# Patient Record
Sex: Male | Born: 1980
Health system: Southern US, Community
[De-identification: ages and names within clinical notes are randomized; demographics above are authoritative.]

## PROBLEM LIST (undated history)

## (undated) DIAGNOSIS — M25562 Pain in left knee: Secondary | ICD-10-CM

## (undated) DIAGNOSIS — S42309A Unspecified fracture of shaft of humerus, unspecified arm, initial encounter for closed fracture: Secondary | ICD-10-CM

## (undated) DIAGNOSIS — G8929 Other chronic pain: Secondary | ICD-10-CM

## (undated) HISTORY — DX: Pain in left knee: M25.562

## (undated) HISTORY — DX: Unspecified fracture of shaft of humerus, unspecified arm, initial encounter for closed fracture: S42.309A

## (undated) HISTORY — DX: Other chronic pain: G89.29

---

## 2012-06-14 ENCOUNTER — Ambulatory Visit (INDEPENDENT_AMBULATORY_CARE_PROVIDER_SITE_OTHER): Payer: BC Managed Care – PPO | Admitting: Physician Assistant

## 2012-06-14 VITALS — BP 110/65 | HR 74 | Temp 98.2°F | Resp 16 | Ht 68.0 in | Wt 223.0 lb

## 2012-06-14 DIAGNOSIS — H9209 Otalgia, unspecified ear: Secondary | ICD-10-CM

## 2012-06-14 DIAGNOSIS — J069 Acute upper respiratory infection, unspecified: Secondary | ICD-10-CM

## 2012-06-14 DIAGNOSIS — J029 Acute pharyngitis, unspecified: Secondary | ICD-10-CM

## 2012-06-14 LAB — POCT RAPID STREP A (OFFICE): Rapid Strep A Screen: NEGATIVE

## 2012-06-14 MED ORDER — AMOXICILLIN 875 MG PO TABS: 875.0000 mg | ORAL_TABLET | Freq: Two times a day (BID) | ORAL | Status: DC

## 2012-06-14 MED ORDER — IPRATROPIUM BROMIDE 0.03 % NA SOLN: 2.0000 | Freq: Two times a day (BID) | NASAL | Status: DC

## 2012-06-14 MED ORDER — GUAIFENESIN ER 1200 MG PO TB12: 1.0000 | ORAL_TABLET | Freq: Two times a day (BID) | ORAL | Status: DC | PRN

## 2012-06-14 NOTE — Progress Notes (Signed)
  Subjective:    Patient ID: Luke Brown, male    DOB: 10/29/1980, 31 y.o.   MRN: [id number]  HPI This 31 y.o. male presents for evaluation of illness.  Began 5 days ago with scratchy throat, achey.  No runny nose, cough.  Feels the drainage in the back of his throat, sinus pressure, and fullness/popping in his ears with swallowing.  The post-nasal drainage keeps him awake at night and he's exhausted from not sleeping.  No history of sinusitis.  No nausea, vomiting or diarrhea. His 40 year-old son, who attends daycare, had a cold about a week ago, accompanied by GI symptoms.  Past Medical History  Diagnosis Date  . Fracture of arm     History reviewed. No pertinent past surgical history.  Prior to Admission medications   Not on File    No Known Allergies  History   Social History  . Marital Status: Married    Spouse Name: Jaeshawn Silvio    Number of Children: 1  . Years of Education: 16   Occupational History  . Training and development officer   Social History Main Topics  . Smoking status: Never Smoker   . Smokeless tobacco: Never Used  . Alcohol Use: No  . Drug Use: No  . Sexually Active: Yes -- Male partner(s)    Birth Control/ Protection: None   Other Topics Concern  . Not on file   Social History Narrative   Lives with wife and their son Elisandro, Jarrett., 10/05/2009).    Family History  Problem Relation Age of Onset  . Diabetes Mother     Review of Systems As above.    Objective:   Physical Exam Blood pressure 110/65, pulse 74, temperature 98.2 F (36.8 C), temperature source Oral, resp. rate 16, height 5\' 8"  (1.727 m), weight 223 lb (101.152 kg), SpO2 99.00%. Body mass index is 33.91 kg/(m^2). Well-developed, well nourished BM who is awake, alert and oriented, in NAD. HEENT: [place]/AT, PERRL, EOMI.  Sclera and conjunctiva are clear.  EAC are patent, TMs are normal in appearance. Nasal mucosa is pink and moist. OP with exudative tonsils bilaterally. Neck:  supple, non-tender, no lymphadenopathy, thyromegaly. Heart: RRR, no murmur Lungs: normal effort, CTA Extremities: no cyanosis, clubbing or edema. Skin: warm and dry without rash. Psychologic: good mood and appropriate affect, normal speech and behavior.  Results for orders placed in visit on 06/14/12  POCT RAPID STREP A (OFFICE)      Component Value Range   Rapid Strep A Screen Negative  Negative      Assessment & Plan:   1. Acute pharyngitis  POCT rapid strep A, Culture, Group A Strep, amoxicillin (AMOXIL) 875 MG tablet  2. Otalgia    3. URI (upper respiratory infection)  ipratropium (ATROVENT) 0.03 % nasal spray, Guaifenesin (MUCINEX MAXIMUM STRENGTH) 1200 MG TB12   Supportive care. Anticipatory guidance provided.

## 2012-06-14 NOTE — Patient Instructions (Signed)
Get plenty of rest and drink at least 64 ounces of water daily. 

## 2014-08-29 ENCOUNTER — Ambulatory Visit (INDEPENDENT_AMBULATORY_CARE_PROVIDER_SITE_OTHER): Payer: BLUE CROSS/BLUE SHIELD | Admitting: Family Medicine

## 2014-08-29 VITALS — BP 118/66 | HR 73 | Temp 98.9°F | Resp 16 | Ht 67.75 in | Wt 194.0 lb

## 2014-08-29 DIAGNOSIS — R5383 Other fatigue: Secondary | ICD-10-CM

## 2014-08-29 DIAGNOSIS — R51 Headache: Secondary | ICD-10-CM

## 2014-08-29 DIAGNOSIS — G4700 Insomnia, unspecified: Secondary | ICD-10-CM

## 2014-08-29 DIAGNOSIS — R519 Headache, unspecified: Secondary | ICD-10-CM

## 2014-08-29 LAB — POCT CBC
Granulocyte percent: 77.8 % (ref 37–80)
HCT, POC: 42.6 % — AB (ref 43.5–53.7)
Hemoglobin: 13.4 g/dL — AB (ref 14.1–18.1)
Lymph, poc: 1.5 (ref 0.6–3.4)
MCH, POC: 26.7 pg — AB (ref 27–31.2)
MCHC: 31.4 g/dL — AB (ref 31.8–35.4)
MCV: 85.2 fL (ref 80–97)
MID (cbc): 0.3 (ref 0–0.9)
MPV: 8.4 fL (ref 0–99.8)
POC Granulocyte: 6.1 (ref 2–6.9)
POC LYMPH PERCENT: 18.4 %L (ref 10–50)
POC MID %: 3.8 % (ref 0–12)
Platelet Count, POC: 161 10*3/uL (ref 142–424)
RBC: 5 M/uL (ref 4.69–6.13)
RDW, POC: 15.8 %
WBC: 7.9 10*3/uL (ref 4.6–10.2)

## 2014-08-29 LAB — COMPLETE METABOLIC PANEL WITHOUT GFR
AST: 24 U/L (ref 0–37)
Albumin: 4.5 g/dL (ref 3.5–5.2)
Alkaline Phosphatase: 32 U/L — ABNORMAL LOW (ref 39–117)
Calcium: 9.4 mg/dL (ref 8.4–10.5)
Creat: 1.09 mg/dL (ref 0.50–1.35)
GFR, Est African American: >89 mL/min
Glucose, Bld: 99 mg/dL (ref 70–99)
Potassium: 4.5 meq/L (ref 3.5–5.3)
Sodium: 140 meq/L (ref 135–145)
Total Bilirubin: 0.4 mg/dL (ref 0.2–1.2)
Total Protein: 7.3 g/dL (ref 6.0–8.3)

## 2014-08-29 LAB — COMPLETE METABOLIC PANEL WITH GFR
ALT: 18 U/L (ref 0–53)
BUN: 13 mg/dL (ref 6–23)
CO2: 26 mEq/L (ref 19–32)
Chloride: 104 mEq/L (ref 96–112)
GFR, Est Non African American: 89 mL/min

## 2014-08-29 MED ORDER — ZOLPIDEM TARTRATE 5 MG PO TABS: 5.0000 mg | ORAL_TABLET | Freq: Every evening | ORAL | Status: DC | PRN

## 2014-08-29 MED ORDER — BUTALBITAL-APAP-CAFF-COD 50-325-40-30 MG PO CAPS: 1.0000 | ORAL_CAPSULE | Freq: Four times a day (QID) | ORAL | Status: DC | PRN

## 2014-08-29 NOTE — Progress Notes (Signed)
Chief Complaint:  Chief Complaint  Patient presents with  . Headache    X2days  . Insomnia  . Fatigue    HPI: Luke Brown is a 34 y.o. male who is here for 3 day history of diffuse frontal and top of the head headache, chills last night, fatigue and also few msk aches .  HE has been working out and felt better but then came right back. He has been hydrating . No etoh in the last few days. Has had sick contacts. No cough, sore throat or ear pain or facial pain. No neck pain, rashes, n/v/abd pain diarrhea. LAst week was a rough sleeping week , he got 3-4 hrs per night No CP, SOB, palptiation, he feels some dizziness, some photsensitivoty Eating and drinking ok. He works 2nd shift.  He has problems tosss and turn, he works out in the AM, he sleeps 3-4 hrs and the rest is toss and turn He does not have a hx of HA It is uncomfortable , he has taken ibuprofen 200 mg 2 pills every 6 hours without relief.   Past Medical History  Diagnosis Date  . Fracture of arm    History reviewed. No pertinent past surgical history. History   Social History  . Marital Status: Married    Spouse Name: Charleston PootBilan Dupree    Number of Children: 1  . 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 Smoking status: Never Smoker   . Smokeless tobacco: Never Used  . Alcohol Use: No  . Drug Use: No  . Sexual Activity:    Partners: Female    CopyBirth Control/ Protection: None   Other Topics Concern  . None   Social History Narrative   Lives with wife and their son Ilda Basset(Damarius, Jr., 10/05/2009).   Family History  Problem Relation Age of Onset  . Diabetes Mother    No Known Allergies Prior to Admission medications   Not on File     ROS: The patient denies fevers, chills, night sweats, unintentional weight loss, chest pain, palpitations, wheezing, dyspnea on exertion, nausea, vomiting, abdominal pain, dysuria, hematuria,  melena, numbness, weakness, or tingling.   All other systems have been reviewed and were otherwise negative with the exception of those mentioned in the HPI and as above.    PHYSICAL EXAM: Filed Vitals:   08/29/14 0859  BP: 118/66  Pulse: 73  Temp: 98.9 F (37.2 C)  Resp: 16   Filed Vitals:   08/29/14 0859  Height: 5' 7.75" (1.721 m)  Weight: 194 lb (87.998 kg)   Body mass index is 29.71 kg/(m^2).  General: Alert, no acute distress HEENT:  Normocephalic, atraumatic, oropharynx patent. EOMI, PERRLA Erythematous throat, no exudates, TM normal, neg sinus tenderness, + erythematous/boggy nasal mucosa Cardiovascular:  Regular rate and rhythm, no rubs murmurs or gallops.  No Carotid bruits, radial pulse intact. No pedal edema.  Respiratory: Clear to auscultation bilaterally.  No wheezes, rales, or rhonchi.  No cyanosis, no use of accessory musculature GI: No organomegaly, abdomen is soft and non-tender, positive bowel sounds.  No masses. Skin: No rashes. Neurologic: Facial musculature symmetric. Psychiatric: Patient is appropriate throughout our interaction. Lymphatic: No cervical lymphadenopathy Musculoskeletal: Gait intact.   LABS: Results for orders placed or performed in visit on 08/29/14  POCT CBC  Result Value Ref Range   WBC 7.9 4.6 - 10.2 K/uL  Lymph, poc 1.5 0.6 - 3.4   POC LYMPH PERCENT 18.4 10 - 50 %L   MID (cbc) 0.3 0 - 0.9   POC MID % 3.8 0 - 12 %M   POC Granulocyte 6.1 2 - 6.9   Granulocyte percent 77.8 37 - 80 %G   RBC 5.00 4.69 - 6.13 M/uL   Hemoglobin 13.4 (A) 14.1 - 18.1 g/dL   HCT, POC 16.1 (A) 09.6 - 53.7 %   MCV 85.2 80 - 97 fL   MCH, POC 26.7 (A) 27 - 31.2 pg   MCHC 31.4 (A) 31.8 - 35.4 g/dL   RDW, POC 04.5 %   Platelet Count, POC 161 142 - 424 K/uL   MPV 8.4 0 - 99.8 fL     EKG/XRAY:   Primary read interpreted by Dr. Conley Rolls at [facility].   ASSESSMENT/PLAN: Encounter Diagnoses  Name Primary?  . Headache, unspecified headache type Yes  . Other  fatigue   . Insomnia    Viral syndrome vs early acute sinusitis I would like him to try sxs treatment first, get some good rest and sleep and if no improvement then give abx Push fluids Rx fioricet and also ambien F/u prn    Gross sideeffects, risk and benefits, and alternatives of medications d/w patient. Patient is aware that all medications have potential sideeffects and we are unable to predict every sideeffect or drug-drug interaction that may occur.  Milady Fleener PHUONG, DO 08/29/2014 10:05 AM

## 2014-08-31 ENCOUNTER — Telehealth: Payer: Self-pay

## 2014-08-31 ENCOUNTER — Other Ambulatory Visit: Payer: Self-pay | Admitting: Family Medicine

## 2014-08-31 DIAGNOSIS — J0120 Acute ethmoidal sinusitis, unspecified: Secondary | ICD-10-CM

## 2014-08-31 MED ORDER — AMOXICILLIN-POT CLAVULANATE 875-125 MG PO TABS: 1.0000 | ORAL_TABLET | Freq: Two times a day (BID) | ORAL | Status: DC

## 2014-08-31 NOTE — Telephone Encounter (Addendum)
Pt stopped in and wanted to let Dr Conley RollsLe know that he has developed a fever Monday evening and some back aches. Fever is know gone but headache and back pain remain. He is wondering if he has a infection. Please advise.He can be reached at [phone], Thank you

## 2015-07-20 ENCOUNTER — Ambulatory Visit (INDEPENDENT_AMBULATORY_CARE_PROVIDER_SITE_OTHER): Payer: BLUE CROSS/BLUE SHIELD | Admitting: Emergency Medicine

## 2015-07-20 VITALS — BP 130/62 | HR 68 | Temp 98.0°F | Resp 16 | Ht 69.25 in | Wt 198.0 lb

## 2015-07-20 DIAGNOSIS — L0100 Impetigo, unspecified: Secondary | ICD-10-CM

## 2015-07-20 MED ORDER — SULFAMETHOXAZOLE-TRIMETHOPRIM 800-160 MG PO TABS: 1.0000 | ORAL_TABLET | Freq: Two times a day (BID) | ORAL | Status: DC

## 2015-07-20 MED ORDER — CEPHALEXIN 500 MG PO CAPS: 500.0000 mg | ORAL_CAPSULE | Freq: Four times a day (QID) | ORAL | Status: DC

## 2015-07-20 NOTE — Patient Instructions (Signed)
Impetigo, Adult  Impetigo is an infection of the skin. It commonly occurs in young children, but it can also occur in adults. The infection causes itchy blisters and sores that produce brownish-yellow fluid. As the fluid dries, it forms a thick, honey-colored crust. These skin changes usually occur on the face but can also affect other areas of the body. Impetigo usually goes away in 7-10 days with treatment.  CAUSES  Impetigo is caused by two types of bacteria. It may be caused by staphylococci or streptococci bacteria. These bacteria cause impetigo when they get under the surface of the skin. This often happens after some damage to the skin, such as damage from:  · Cuts, scrapes, or scratches.  · Insect bites, especially when you scratch the area of a bite.  · Chickenpox or other illnesses that cause open skin sores.  · Nail biting or chewing.  Impetigo is contagious and can spread easily from one person to another. This may occur through close skin contact or by sharing towels, clothing, or other items with a person who has the infection.  RISK FACTORS  Some things that can increase the risk of getting this infection include:  · Playing sports that include skin-to-skin contact with others.  · Having a skin condition with open sores.  · Having many skin cuts or scrapes.  · Living in an area that has high humidity levels.  · Having poor hygiene.  · Having high levels of staphylococci in your nose.  SIGNS AND SYMPTOMS  Impetigo usually starts out as small blisters, often on the face. The blisters then break open and turn into tiny sores (lesions) with a yellow crust. In some cases, the blisters cause itching or burning. With scratching, irritation, or lack of treatment, these small lesions may get larger. Scratching can also cause impetigo to spread to other parts of the body. The bacteria can get under the fingernails and spread when you touch another area of your skin.  Other possible symptoms include:  · Larger  blisters.  · Pus.  · Swollen lymph glands.  DIAGNOSIS  This condition is usually diagnosed during a physical exam. A skin sample or sample of fluid from a blister may be taken for lab tests that involve growing bacteria (culture test). This can help confirm the diagnosis or help determine the best treatment.  TREATMENT  Mild impetigo can be treated with prescription antibiotic cream. Oral antibiotic medicine may be used in more severe cases. Medicines for itching may also be used.  HOME CARE INSTRUCTIONS  · Take medicines only as directed by your health care provider.  · To help prevent impetigo from spreading to other body areas:    Keep your fingernails short and clean.    Do not scratch the blisters or sores.    Cover infected areas, if necessary, to keep from scratching.  · Gently wash the infected areas with antibiotic soap and water.  · Soak crusted areas in warm, soapy water using antibiotic soap.    Gently rub the areas to remove crusts. Do not scrub.  · Wash your hands often to avoid spreading this infection.  · Stay home until you have used an antibiotic cream for 48 hours (2 days) or an oral antibiotic medicine for 24 hours (1 day). You should only return to work and activities with other people if your skin shows significant improvement.  PREVENTION   To keep the infection from spreading:  · Stay home until you have used   an antibiotic cream for 48 hours or an oral antibiotic for 24 hours.  · Wash your hands often.  · Do not engage in skin-to-skin contact with other people while you have still have blisters.  · Do not share towels, washcloths, or bedding with others while you have the infection.  SEEK MEDICAL CARE IF:  · You develop more blisters or sores despite treatment.  · Other family members get sores.  · Your skin sores are not improving after 48 hours of treatment.  · You have a fever.  SEEK IMMEDIATE MEDICAL CARE IF:  · You see spreading redness or swelling of the skin around your sores.  · You  see red streaks coming from your sores.  · You develop a sore throat.     This information is not intended to replace advice given to you by your health care provider. Make sure you discuss any questions you have with your health care provider.     Document Released: 07/29/2014 Document Reviewed: 07/29/2014  Elsevier Interactive Patient Education ©2016 [employer] Inc[employer]

## 2015-07-20 NOTE — Progress Notes (Signed)
Subjective:  Patient ID: Luke Brown, male    DOB: 1980-08-27  Age: 34 y.o. MRN: [id number]  CC: Mouth Lesions and Oral Swelling   HPI Luke BergamoMarcus Brown presents  with a history of herpes labialis. Had a lesion on his lower lip that now has resulted in having a markedly swollen lower lip is no fever or chills. He has no abscess. His lipids to uniformly diffusely swollen he has a lesion in the middle portion of this lip which is crusted over. He has no other complaints. No nasal congestion postnasal drainage or sore throat.  History Luke Brown has a past medical history of Fracture of arm.   He has no past surgical history on file.   His  family history includes Diabetes in his mother.  He   reports that he has never smoked. He has never used smokeless tobacco. He reports that he does not drink alcohol or use illicit drugs.  Outpatient Prescriptions Prior to Visit  Medication Sig Dispense Refill  . amoxicillin-clavulanate (AUGMENTIN) 875-125 MG per tablet Take 1 tablet by mouth 2 (two) times daily. May cause nausea, abd pain, diarrhea, vomiting, rash. Take with food (Patient not taking: Reported on 07/20/2015) 20 tablet 0  . butalbital-acetaminophen-caffeine (FIORICET/CODEINE) 50-325-40-30 MG per capsule Take 1 capsule by mouth every 6 (six) hours as needed for headache. (Patient not taking: Reported on 07/20/2015) 20 capsule 0  . zolpidem (AMBIEN) 5 MG tablet Take 1 tablet (5 mg total) by mouth at bedtime as needed for sleep. (Patient not taking: Reported on 07/20/2015) 15 tablet 0   No facility-administered medications prior to visit.    Social History   Social History  . Marital Status: Married    Spouse Name: Luke Brown  . Number of Children: 1  . 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 Smoking status: Never Smoker   . Smokeless tobacco: Never Used  . Alcohol Use: No  . Drug Use: No  .  Sexual Activity:    Partners: Female    CopyBirth Control/ Protection: None   Other Topics Concern  . None   Social History Narrative   Lives with wife and their son Luke Brown(Rami, Jr., 10/05/2009).     Review of Systems  Constitutional: Negative for fever, chills and appetite change.  HENT: Positive for facial swelling and mouth sores. Negative for congestion, ear pain, postnasal drip, sinus pressure and sore throat.   Eyes: Negative for pain and redness.  Respiratory: Negative for cough, shortness of breath and wheezing.   Cardiovascular: Negative for leg swelling.  Gastrointestinal: Negative for nausea, vomiting, abdominal pain, diarrhea, constipation and blood in stool.  Endocrine: Negative for polyuria.  Genitourinary: Negative for dysuria, urgency, frequency and flank pain.  Musculoskeletal: Negative for gait problem.  Skin: Negative for rash.  Neurological: Negative for weakness and headaches.  Psychiatric/Behavioral: Negative for confusion and decreased concentration. The patient is not nervous/anxious.     Objective:  BP 130/62 mmHg  Pulse 68  Temp(Src) 98 F (36.7 C) (Oral)  Resp 16  Ht 5' 9.25" (1.759 m)  Wt 198 lb (89.812 kg)  BMI 29.03 kg/m2  SpO2 98%  Physical Exam  Constitutional: He is oriented to person, place, and time. He appears well-developed and well-nourished. No distress.  HENT:  Head: Normocephalic and atraumatic.  Right Ear: External ear normal.  Left Ear: External ear normal.  Nose: Nose normal.  Marked swelling lower lip with impetigo  Eyes: Conjunctivae and EOM are normal. Pupils are equal, round, and reactive to light. No scleral icterus.  Neck: Normal range of motion. Neck supple. No tracheal deviation present.  Cardiovascular: Normal rate, regular rhythm and normal heart sounds.   Pulmonary/Chest: Effort normal. No respiratory distress. He has no wheezes. He has no rales.  Abdominal: He exhibits no mass. There is no tenderness. There is no  rebound and no guarding.  Musculoskeletal: He exhibits no edema.  Lymphadenopathy:    He has no cervical adenopathy.  Neurological: He is alert and oriented to person, place, and time. Coordination normal.  Skin: Skin is warm and dry. No rash noted.  Psychiatric: He has a normal mood and affect. His behavior is normal.      Assessment & Plan:   Luke Brown was seen today for mouth lesions and oral swelling.  Diagnoses and all orders for this visit:  Impetigo  Other orders -     sulfamethoxazole-trimethoprim (BACTRIM DS,SEPTRA DS) 800-160 MG tablet; Take 1 tablet by mouth 2 (two) times daily. -     cephALEXin (KEFLEX) 500 MG capsule; Take 1 capsule (500 mg total) by mouth 4 (four) times daily.  I am having Luke Brown start on sulfamethoxazole-trimethoprim and cephALEXin. I am also having him maintain his butalbital-acetaminophen-caffeine, zolpidem, amoxicillin-clavulanate, and ValACYclovir HCl (VALTREX PO).  Meds ordered this encounter  Medications  . ValACYclovir HCl (VALTREX PO)    Sig: Take by mouth.  . sulfamethoxazole-trimethoprim (BACTRIM DS,SEPTRA DS) 800-160 MG tablet    Sig: Take 1 tablet by mouth 2 (two) times daily.    Dispense:  20 tablet    Refill:  0  . cephALEXin (KEFLEX) 500 MG capsule    Sig: Take 1 capsule (500 mg total) by mouth 4 (four) times daily.    Dispense:  40 capsule    Refill:  0    Appropriate red flag conditions were discussed with the patient as well as actions that should be taken.  Patient expressed his understanding.  Follow-up: Return if symptoms worsen or fail to improve.  Carmelina Dane, MD

## 2016-07-20 ENCOUNTER — Encounter: Payer: Self-pay | Admitting: Emergency Medicine

## 2016-07-20 ENCOUNTER — Emergency Department
Admission: EM | Admit: 2016-07-20 | Discharge: 2016-07-20 | Disposition: A | Discharge status: Home / Self Care | Payer: BLUE CROSS/BLUE SHIELD | Source: Home / Self Care | Attending: Family Medicine | Admitting: Family Medicine

## 2016-07-20 DIAGNOSIS — H6692 Otitis media, unspecified, left ear: Secondary | ICD-10-CM

## 2016-07-20 DIAGNOSIS — R6889 Other general symptoms and signs: Secondary | ICD-10-CM

## 2016-07-20 MED ORDER — BENZONATATE 100 MG PO CAPS: 100.0000 mg | ORAL_CAPSULE | Freq: Three times a day (TID) | ORAL | 0 refills | Status: DC

## 2016-07-20 MED ORDER — AMOXICILLIN-POT CLAVULANATE 875-125 MG PO TABS: 1.0000 | ORAL_TABLET | Freq: Two times a day (BID) | ORAL | 0 refills | Status: DC

## 2016-07-20 MED ORDER — OSELTAMIVIR PHOSPHATE 75 MG PO CAPS: 75.0000 mg | ORAL_CAPSULE | Freq: Two times a day (BID) | ORAL | 0 refills | Status: DC

## 2016-07-20 NOTE — ED Provider Notes (Signed)
CSN: [id number]     Arrival date & time 07/20/16  1205 History   First MD Initiated Contact with Patient 07/20/16 1258     Chief Complaint  Patient presents with  . Fever   (Consider location/radiation/quality/duration/timing/severity/associated sxs/prior Treatment) HPI  [name] is a 35 y.o. male presenting to UC with c/o 2 days of sudden onset, gradually worsening fatigue, body aches, sore throat, headache and bilateral ear pain.  He had Tylenol at 9AM.  Fever tmax 103*F. He did not get the flu vaccine this season. Denies vomiting or diarrhea but has had some nausea.    Past Medical History:  Diagnosis Date  . Fracture of arm    History reviewed. No pertinent surgical history. Family History  Problem Relation Age of Onset  . Diabetes Mother    Social History  Substance Use Topics  . Smoking status: Never Smoker  . Smokeless tobacco: Never Used  . Alcohol use No    Review of Systems  Constitutional: Positive for fatigue and fever. Negative for chills.  HENT: Positive for congestion, ear pain (bilateral), postnasal drip, rhinorrhea and sore throat. Negative for trouble swallowing and voice change.   Respiratory: Positive for cough. Negative for shortness of breath.   Cardiovascular: Negative for chest pain and palpitations.  Gastrointestinal: Negative for abdominal pain, diarrhea, nausea and vomiting.  Musculoskeletal: Positive for arthralgias and myalgias. Negative for back pain.  Skin: Negative for rash.    Allergies  Patient has no known allergies.  Home Medications   Prior to Admission medications   Medication Sig Start Date End Date Taking? Authorizing Provider  amoxicillin-clavulanate (AUGMENTIN) 875-125 MG tablet Take 1 tablet by mouth 2 (two) times daily. One po bid x 7 days 07/20/16   Junius FinnerErin O'Malley, PA-C  benzonatate (TESSALON) 100 MG capsule Take 1-2 capsules (100-200 mg total) by mouth every 8 (eight) hours. 07/20/16   Junius FinnerErin O'Malley, PA-C   butalbital-acetaminophen-caffeine (FIORICET/CODEINE) 50-325-40-30 MG per capsule Take 1 capsule by mouth every 6 (six) hours as needed for headache. Patient not taking: Reported on 07/20/2015 08/29/14   Thao P Le, DO  cephALEXin (KEFLEX) 500 MG capsule Take 1 capsule (500 mg total) by mouth 4 (four) times daily. 07/20/15   Carmelina DaneJeffery S Anderson, MD  oseltamivir (TAMIFLU) 75 MG capsule Take 1 capsule (75 mg total) by mouth every 12 (twelve) hours. 07/20/16   Junius FinnerErin O'Malley, PA-C  sulfamethoxazole-trimethoprim (BACTRIM DS,SEPTRA DS) 800-160 MG tablet Take 1 tablet by mouth 2 (two) times daily. 07/20/15   Carmelina DaneJeffery S Anderson, MD  ValACYclovir HCl (VALTREX PO) Take by mouth.    Historical Provider, MD  zolpidem (AMBIEN) 5 MG tablet Take 1 tablet (5 mg total) by mouth at bedtime as needed for sleep. Patient not taking: Reported on 07/20/2015 08/29/14   Thao P Le, DO   Meds Ordered and Administered this Visit  Medications - No data to display  BP 113/75 (BP Location: Right Arm)   Pulse 68   Temp 101.5 F (38.6 C) (Oral)   Wt 200 lb (90.7 kg)   SpO2 100%   BMI 29.32 kg/m  No data found.   Physical Exam  Constitutional: He appears well-developed and well-nourished.  Non-toxic appearance. He appears ill (very fatigued, but no cardiorespiratory distress). No distress.  HENT:  Head: Normocephalic and atraumatic.  Right Ear: Tympanic membrane and external ear normal.  Left Ear: External ear normal. Tympanic membrane is erythematous and bulging.  Nose: Rhinorrhea present.  Mouth/Throat: Uvula is midline and mucous membranes  are normal. Posterior oropharyngeal erythema (mild redness ) present. No oropharyngeal exudate.  Eyes: Conjunctivae are normal. Right eye exhibits no discharge. Left eye exhibits no discharge. No scleral icterus.  Neck: Neck supple.  Cardiovascular: Normal rate, regular rhythm and normal heart sounds.   Pulmonary/Chest: Breath sounds normal. No stridor. No respiratory distress. He  has no wheezes. He has no rales.  Abdominal: Soft. There is no tenderness.  Musculoskeletal: He exhibits no edema.  Lymphadenopathy:    He has no cervical adenopathy.  Neurological: He is alert.  Skin: Skin is warm and intact. No rash noted. He is diaphoretic.  Psychiatric: He has a normal mood and affect.  Nursing note and vitals reviewed.   Urgent Care Course   Clinical Course     Procedures (including critical care time)  Labs Review Labs Reviewed - No data to display  Imaging Review No results found.    MDM   1. Flu-like symptoms   2. Left acute otitis media    Pt presenting to UC with 2 days of flu-like symptoms. Exam also c/w Left AOM  Rx: Tamiflu, augmentin, tessalon Encouraged fluids, rest, acetaminophen, ibuprofen. F/u with PCP in 1 week if not improving, sooner if worsening.    Junius FinnerErin O'Malley, PA-C 07/20/16 1312

## 2016-07-20 NOTE — ED Triage Notes (Signed)
Pt c/o fatigue, body aches, sore throat and fever for 2 days. Reports max temp at home was 103 yesterday. Had tylenol at 9am/

## 2016-10-25 DIAGNOSIS — M25572 Pain in left ankle and joints of left foot: Secondary | ICD-10-CM | POA: Diagnosis not present

## 2016-10-25 DIAGNOSIS — M71571 Other bursitis, not elsewhere classified, right ankle and foot: Secondary | ICD-10-CM | POA: Diagnosis not present

## 2016-10-25 DIAGNOSIS — M7661 Achilles tendinitis, right leg: Secondary | ICD-10-CM | POA: Diagnosis not present

## 2016-10-25 DIAGNOSIS — M2012 Hallux valgus (acquired), left foot: Secondary | ICD-10-CM | POA: Diagnosis not present

## 2016-10-25 DIAGNOSIS — M2011 Hallux valgus (acquired), right foot: Secondary | ICD-10-CM | POA: Diagnosis not present

## 2016-11-25 DIAGNOSIS — H40023 Open angle with borderline findings, high risk, bilateral: Secondary | ICD-10-CM | POA: Diagnosis not present

## 2017-02-03 DIAGNOSIS — M722 Plantar fascial fibromatosis: Secondary | ICD-10-CM | POA: Diagnosis not present

## 2017-02-03 DIAGNOSIS — M71571 Other bursitis, not elsewhere classified, right ankle and foot: Secondary | ICD-10-CM | POA: Diagnosis not present

## 2017-06-23 DIAGNOSIS — M9903 Segmental and somatic dysfunction of lumbar region: Secondary | ICD-10-CM | POA: Diagnosis not present

## 2017-06-23 DIAGNOSIS — M9905 Segmental and somatic dysfunction of pelvic region: Secondary | ICD-10-CM | POA: Diagnosis not present

## 2017-06-23 DIAGNOSIS — M5386 Other specified dorsopathies, lumbar region: Secondary | ICD-10-CM | POA: Diagnosis not present

## 2017-06-23 DIAGNOSIS — M9902 Segmental and somatic dysfunction of thoracic region: Secondary | ICD-10-CM | POA: Diagnosis not present

## 2017-07-28 DIAGNOSIS — M9902 Segmental and somatic dysfunction of thoracic region: Secondary | ICD-10-CM | POA: Diagnosis not present

## 2017-07-28 DIAGNOSIS — M9903 Segmental and somatic dysfunction of lumbar region: Secondary | ICD-10-CM | POA: Diagnosis not present

## 2017-07-28 DIAGNOSIS — M5386 Other specified dorsopathies, lumbar region: Secondary | ICD-10-CM | POA: Diagnosis not present

## 2017-07-28 DIAGNOSIS — M9905 Segmental and somatic dysfunction of pelvic region: Secondary | ICD-10-CM | POA: Diagnosis not present

## 2017-07-30 DIAGNOSIS — M9905 Segmental and somatic dysfunction of pelvic region: Secondary | ICD-10-CM | POA: Diagnosis not present

## 2017-07-30 DIAGNOSIS — M9903 Segmental and somatic dysfunction of lumbar region: Secondary | ICD-10-CM | POA: Diagnosis not present

## 2017-07-30 DIAGNOSIS — M9902 Segmental and somatic dysfunction of thoracic region: Secondary | ICD-10-CM | POA: Diagnosis not present

## 2017-07-30 DIAGNOSIS — M5386 Other specified dorsopathies, lumbar region: Secondary | ICD-10-CM | POA: Diagnosis not present

## 2017-08-02 DIAGNOSIS — M9902 Segmental and somatic dysfunction of thoracic region: Secondary | ICD-10-CM | POA: Diagnosis not present

## 2017-08-02 DIAGNOSIS — M5386 Other specified dorsopathies, lumbar region: Secondary | ICD-10-CM | POA: Diagnosis not present

## 2017-08-02 DIAGNOSIS — M9905 Segmental and somatic dysfunction of pelvic region: Secondary | ICD-10-CM | POA: Diagnosis not present

## 2017-08-02 DIAGNOSIS — M9903 Segmental and somatic dysfunction of lumbar region: Secondary | ICD-10-CM | POA: Diagnosis not present

## 2017-08-04 DIAGNOSIS — M9903 Segmental and somatic dysfunction of lumbar region: Secondary | ICD-10-CM | POA: Diagnosis not present

## 2017-08-04 DIAGNOSIS — M9902 Segmental and somatic dysfunction of thoracic region: Secondary | ICD-10-CM | POA: Diagnosis not present

## 2017-08-04 DIAGNOSIS — M9905 Segmental and somatic dysfunction of pelvic region: Secondary | ICD-10-CM | POA: Diagnosis not present

## 2017-08-04 DIAGNOSIS — M5386 Other specified dorsopathies, lumbar region: Secondary | ICD-10-CM | POA: Diagnosis not present

## 2017-08-07 DIAGNOSIS — M71571 Other bursitis, not elsewhere classified, right ankle and foot: Secondary | ICD-10-CM | POA: Diagnosis not present

## 2017-08-07 DIAGNOSIS — M722 Plantar fascial fibromatosis: Secondary | ICD-10-CM | POA: Diagnosis not present

## 2017-08-07 DIAGNOSIS — M7731 Calcaneal spur, right foot: Secondary | ICD-10-CM | POA: Diagnosis not present

## 2017-09-16 ENCOUNTER — Emergency Department (INDEPENDENT_AMBULATORY_CARE_PROVIDER_SITE_OTHER): Payer: BLUE CROSS/BLUE SHIELD

## 2017-09-16 ENCOUNTER — Other Ambulatory Visit: Payer: Self-pay

## 2017-09-16 ENCOUNTER — Emergency Department
Admission: EM | Admit: 2017-09-16 | Discharge: 2017-09-16 | Disposition: A | Discharge status: Home / Self Care | Payer: BLUE CROSS/BLUE SHIELD | Source: Home / Self Care | Attending: Family Medicine | Admitting: Family Medicine

## 2017-09-16 DIAGNOSIS — R109 Unspecified abdominal pain: Secondary | ICD-10-CM | POA: Diagnosis not present

## 2017-09-16 DIAGNOSIS — R140 Abdominal distension (gaseous): Secondary | ICD-10-CM | POA: Diagnosis not present

## 2017-09-16 NOTE — ED Triage Notes (Signed)
Pt stated that today he feels very bloated, and feels like gas is trapped in his stomach.  He tried Tums early today with no relief.  Before coming in today he took gas-X, and is feeling slightly better.

## 2017-09-16 NOTE — ED Provider Notes (Signed)
[facility]    CSN: [id number] Arrival date & time: 09/16/17  1442     History   Chief Complaint Chief Complaint  Patient presents with  . Bloated    HPI Luke Brown is a 37 y.o. male.   Patient reports that he developed abdominal gas and bloating at about 9:30am.  He had not eaten breakfast, and had only coffee this morning.  He tried Tums without relief, and then purchased Gas-X, which was somewhat helpful.  No nausea/vomiting.  Bowel movements have been normal.  No fevers, chills, and sweats.             The history is provided by the patient.  Abdominal Pain  Pain location:  Generalized Pain quality: bloating   Pain radiates to:  Does not radiate Pain severity:  Mild Onset quality:  Sudden Duration:  6 hours Timing:  Constant Progression:  Unchanged Chronicity:  New Context: eating   Context: not recent illness, not recent travel, not sick contacts and not suspicious food intake   Relieved by:  Nothing Worsened by:  Nothing Ineffective treatments: Tums. Associated symptoms: no anorexia, no belching, no chest pain, no chills, no constipation, no cough, no diarrhea, no dysuria, no fatigue, no fever, no flatus, no hematemesis, no hematochezia, no hematuria, no melena, no nausea, no shortness of breath and no vomiting     Past Medical History:  Diagnosis Date  . Fracture of arm     There are no active problems to display for this patient.   History reviewed. No pertinent surgical history.     Home Medications    Prior to Admission medications   Medication Sig Start Date End Date Taking? Authorizing Provider  amoxicillin-clavulanate (AUGMENTIN) 875-125 MG tablet Take 1 tablet by mouth 2 (two) times daily. One po bid x 7 days 07/20/16   Lurene Shadow, PA-C  benzonatate (TESSALON) 100 MG capsule Take 1-2 capsules (100-200 mg total) by mouth every 8 (eight) hours. 07/20/16   Lurene Shadow, PA-C  butalbital-acetaminophen-caffeine  (FIORICET/CODEINE) [phone] MG per capsule Take 1 capsule by mouth every 6 (six) hours as needed for headache. Patient not taking: Reported on 07/20/2015 08/29/14   Le, Thao P, DO  cephALEXin (KEFLEX) 500 MG capsule Take 1 capsule (500 mg total) by mouth 4 (four) times daily. 07/20/15   Carmelina Dane, MD  oseltamivir (TAMIFLU) 75 MG capsule Take 1 capsule (75 mg total) by mouth every 12 (twelve) hours. 07/20/16   Lurene Shadow, PA-C  sulfamethoxazole-trimethoprim (BACTRIM DS,SEPTRA DS) 800-160 MG tablet Take 1 tablet by mouth 2 (two) times daily. 07/20/15   Carmelina Dane, MD  ValACYclovir HCl (VALTREX PO) Take by mouth.    [provider]  zolpidem (AMBIEN) 5 MG tablet Take 1 tablet (5 mg total) by mouth at bedtime as needed for sleep. Patient not taking: Reported on 07/20/2015 08/29/14   Lenell Antu, DO    Family History Family History  Problem Relation Age of Onset  . Diabetes Mother     Social History Social History   Tobacco Use  . Smoking status: Never Smoker  . Smokeless tobacco: Never Used  Substance Use Topics  . Alcohol use: No  . Drug use: No     Allergies   Patient has no known allergies.   Review of Systems Review of Systems  Constitutional: Negative for chills, fatigue and fever.  Respiratory: Negative for cough and shortness of breath.   Cardiovascular: Negative for chest  pain.  Gastrointestinal: Positive for abdominal pain. Negative for anorexia, constipation, diarrhea, flatus, hematemesis, hematochezia, melena, nausea and vomiting.  Genitourinary: Negative for dysuria and hematuria.     Physical Exam Triage Vital Signs ED Triage Vitals  Enc Vitals Group     BP 09/16/17 1556 120/68     Pulse Rate 09/16/17 1556 (!) 55     Resp --      Temp 09/16/17 1556 97.9 F (36.6 C)     Temp Source 09/16/17 1556 Oral     SpO2 09/16/17 1556 99 %     Weight 09/16/17 1557 206 lb (93.4 kg)     Height 09/16/17 1557 5' 9.5" (1.765 m)     Head  Circumference --      Peak Flow --      Pain Score 09/16/17 1557 7     Pain Loc --      Pain Edu? --      Excl. in GC? --    No data found.  Updated Vital Signs BP 120/68 (BP Location: Right Arm)   Pulse (!) 55   Temp 97.9 F (36.6 C) (Oral)   Ht 5' 9.5" (1.765 m)   Wt 206 lb (93.4 kg)   SpO2 99%   BMI 29.98 kg/m   Visual Acuity Right Eye Distance:   Left Eye Distance:   Bilateral Distance:    Right Eye Near:   Left Eye Near:    Bilateral Near:     Physical Exam  Constitutional: He appears well-developed and well-nourished. No distress.  HENT:  Head: Normocephalic.  Right Ear: External ear normal.  Left Ear: External ear normal.  Nose: Nose normal.  Mouth/Throat: Oropharynx is clear and moist.  Eyes: Conjunctivae are normal.  Neck: Neck supple.  Cardiovascular: Normal heart sounds.  Pulmonary/Chest: Breath sounds normal.  Abdominal: Soft. He exhibits no distension. Bowel sounds are decreased. There is no hepatosplenomegaly. There is tenderness in the periumbilical area. There is no rigidity, no guarding, no tenderness at McBurney's point and negative Murphy's sign.    Vague periumbilical and left lower quadrant tenderness as noted on diagram.   Musculoskeletal: He exhibits no edema.  Lymphadenopathy:    He has no cervical adenopathy.  Neurological: He is alert.  Skin: Skin is dry.  Nursing note and vitals reviewed.    UC Treatments / Results  Labs (all labs ordered are listed, but only abnormal results are displayed) Labs Reviewed - No data to display  EKG  EKG Interpretation None       Radiology CLINICAL DATA:  Abdomen pain  EXAM: ABDOMEN - 1 VIEW  COMPARISON:  None.  FINDINGS: Nonobstructed gas pattern with moderate stool in the colon. No abnormal calcifications.  IMPRESSION: Nonobstructed bowel-gas pattern   Electronically Signed   By: Jasmine Pang M.D.   On: 09/16/2017 16:51  Procedures Procedures (including critical  care time)  Medications Ordered in UC Medications - No data to display   Initial Impression / Assessment and Plan / UC Course  I have reviewed the triage vital signs and the nursing notes.  Pertinent labs & imaging results that were available during my care of the patient were reviewed by me and considered in my medical decision making (see chart for details).    Patient declined CBC. Non-obstructive bowel gas pattern on KUB reassuring. Begin clear liquids for about 18 to 24 hours, then may begin a BRAT diet (Bananas, Rice, Applesauce, Toast) when abdominal bloating improved. Then gradually advance to  a regular diet as tolerated.    May take simethicone (Gas-X etc.) as needed. Followup with Family Doctor if not improved in one week.   If symptoms become significantly worse during the night or over the weekend, proceed to the local emergency room.      Final Clinical Impressions(s) / UC Diagnoses   Final diagnoses:  Abdominal bloating    ED Discharge Orders    None           Lattie HawBeese, Iveth Heidemann A, MD 09/26/17 [phone]

## 2017-09-16 NOTE — Discharge Instructions (Signed)
Begin clear liquids for about 18 to 24 hours, then may begin a BRAT diet (Bananas, Rice, Applesauce, Toast) when abdominal bloating improved. Then gradually advance to a regular diet as tolerated.    May take simethicone (Gas-X etc.) as needed.  If symptoms become significantly worse during the night or over the weekend, proceed to the local emergency room.

## 2017-09-29 DIAGNOSIS — L858 Other specified epidermal thickening: Secondary | ICD-10-CM | POA: Insufficient documentation

## 2017-09-29 DIAGNOSIS — L819 Disorder of pigmentation, unspecified: Secondary | ICD-10-CM | POA: Insufficient documentation

## 2017-09-29 DIAGNOSIS — L730 Acne keloid: Secondary | ICD-10-CM | POA: Diagnosis not present

## 2017-12-16 DIAGNOSIS — Z0000 Encounter for general adult medical examination without abnormal findings: Secondary | ICD-10-CM | POA: Diagnosis not present

## 2018-01-02 DIAGNOSIS — M25562 Pain in left knee: Secondary | ICD-10-CM | POA: Diagnosis not present

## 2018-01-02 DIAGNOSIS — M25561 Pain in right knee: Secondary | ICD-10-CM | POA: Diagnosis not present

## 2018-01-14 DIAGNOSIS — M25561 Pain in right knee: Secondary | ICD-10-CM | POA: Diagnosis not present

## 2018-01-14 DIAGNOSIS — M25562 Pain in left knee: Secondary | ICD-10-CM | POA: Diagnosis not present

## 2018-02-13 ENCOUNTER — Encounter: Payer: Self-pay | Admitting: Emergency Medicine

## 2018-02-13 ENCOUNTER — Emergency Department
Admission: EM | Admit: 2018-02-13 | Discharge: 2018-02-13 | Disposition: A | Discharge status: Home / Self Care | Payer: BLUE CROSS/BLUE SHIELD | Source: Home / Self Care | Attending: Family Medicine | Admitting: Family Medicine

## 2018-02-13 DIAGNOSIS — T63441A Toxic effect of venom of bees, accidental (unintentional), initial encounter: Secondary | ICD-10-CM

## 2018-02-13 MED ORDER — DEXAMETHASONE SODIUM PHOSPHATE 10 MG/ML IJ SOLN
10.0000 mg | Freq: Once | INTRAMUSCULAR | Status: AC
  Administered 2018-02-13: 10 mg via INTRAMUSCULAR

## 2018-02-13 MED ORDER — KETOROLAC TROMETHAMINE 60 MG/2ML IM SOLN
60.0000 mg | Freq: Once | INTRAMUSCULAR | Status: AC
  Administered 2018-02-13: 60 mg via INTRAMUSCULAR

## 2018-02-13 NOTE — ED Provider Notes (Signed)
[facility]    CSN: [id number] Arrival date & time: 02/13/18  1934     History   Chief Complaint Chief Complaint  Patient presents with  . Insect Bite    HPI Luke Brown is a 37 y.o. male.   HPI  Luke Brown is a 37 y.o. male presenting to UC with c/o multiple bee stings to arms and legs that occurred about 30 minutes PTA while he was mowing his grass. Pain is stinging and burning, worse on his legs.  No hx of allergic reaction but his friend advised him to be evaluated at [facility] and take benadryl.  He has not taken anything PTA. Pain is moderate in severity. Denies oral swelling, trouble breathing, nausea or vomiting.    Past Medical History:  Diagnosis Date  . Fracture of arm     There are no active problems to display for this patient.   History reviewed. No pertinent surgical history.     Home Medications    Prior to Admission medications   Medication Sig Start Date End Date Taking? Authorizing Provider  amoxicillin-clavulanate (AUGMENTIN) 875-125 MG tablet Take 1 tablet by mouth 2 (two) times daily. One po bid x 7 days 07/20/16   Lurene Shadow, PA-C  benzonatate (TESSALON) 100 MG capsule Take 1-2 capsules (100-200 mg total) by mouth every 8 (eight) hours. 07/20/16   Lurene Shadow, PA-C  butalbital-acetaminophen-caffeine (FIORICET/CODEINE) [phone] MG per capsule Take 1 capsule by mouth every 6 (six) hours as needed for headache. Patient not taking: Reported on 07/20/2015 08/29/14   Le, Thao P, DO  cephALEXin (KEFLEX) 500 MG capsule Take 1 capsule (500 mg total) by mouth 4 (four) times daily. 07/20/15   Carmelina Dane, MD  oseltamivir (TAMIFLU) 75 MG capsule Take 1 capsule (75 mg total) by mouth every 12 (twelve) hours. 07/20/16   Lurene Shadow, PA-C  sulfamethoxazole-trimethoprim (BACTRIM DS,SEPTRA DS) 800-160 MG tablet Take 1 tablet by mouth 2 (two) times daily. 07/20/15   Carmelina Dane, MD  ValACYclovir HCl (VALTREX PO) Take by mouth.     [provider]  zolpidem (AMBIEN) 5 MG tablet Take 1 tablet (5 mg total) by mouth at bedtime as needed for sleep. Patient not taking: Reported on 07/20/2015 08/29/14   Lenell Antu, DO    Family History Family History  Problem Relation Age of Onset  . Diabetes Mother     Social History Social History   Tobacco Use  . Smoking status: Never Smoker  . Smokeless tobacco: Never Used  Substance Use Topics  . Alcohol use: No  . Drug use: No     Allergies   Patient has no known allergies.   Review of Systems Review of Systems  HENT: Negative for facial swelling, sore throat, trouble swallowing and voice change.   Respiratory: Negative for chest tightness, shortness of breath, wheezing and stridor.   Cardiovascular: Negative for chest pain and palpitations.  Gastrointestinal: Negative for nausea and vomiting.  Musculoskeletal: Positive for joint swelling and myalgias.  Skin: Positive for color change and wound (multiple bee stings).     Physical Exam Triage Vital Signs ED Triage Vitals  Enc Vitals Group     BP 02/13/18 1947 109/65     Pulse Rate 02/13/18 1947 80     Resp --      Temp 02/13/18 1947 98.1 F (36.7 C)     Temp Source 02/13/18 1947 Oral     SpO2 02/13/18 1947 96 %  Weight 02/13/18 1948 193 lb (87.5 kg)     Height --      Head Circumference --      Peak Flow --      Pain Score 02/13/18 1948 0     Pain Loc --      Pain Edu? --      Excl. in GC? --    No data found.  Updated Vital Signs BP 109/65 (BP Location: Right Arm)   Pulse 80   Temp 98.1 F (36.7 C) (Oral)   Wt 193 lb (87.5 kg)   SpO2 96%   BMI 28.09 kg/m   Visual Acuity Right Eye Distance:   Left Eye Distance:   Bilateral Distance:    Right Eye Near:   Left Eye Near:    Bilateral Near:     Physical Exam  Constitutional: He is oriented to person, place, and time. He appears well-developed and well-nourished. No distress.  HENT:  Head: Normocephalic and atraumatic.    Mouth/Throat: Oropharynx is clear and moist.  No oral swelling  Eyes: EOM are normal.  Neck: Normal range of motion. Neck supple.  Cardiovascular: Normal rate.  Pulmonary/Chest: Effort normal. No stridor. No respiratory distress. He has no wheezes.  Musculoskeletal: Normal range of motion.  Full ROM upper and lower extremities bilaterally   Neurological: He is alert and oriented to person, place, and time.  Skin: Skin is warm and dry. He is not diaphoretic. There is erythema.  About 6 areas c/w bee stings to Right side of neck, arms, fingers and bilateral legs. Areas are about 1-2cm in diameter, erythematous, mildly swollen. Mild tenderness. No stingers visualized on exam.   Psychiatric: He has a normal mood and affect. His behavior is normal.  Nursing note and vitals reviewed.    UC Treatments / Results  Labs (all labs ordered are listed, but only abnormal results are displayed) Labs Reviewed - No data to display  EKG None  Radiology No results found.  Procedures Procedures (including critical care time)  Medications Ordered in UC Medications  ketorolac (TORADOL) injection 60 mg (60 mg Intramuscular Given 02/13/18 1956)  dexamethasone (DECADRON) injection 10 mg (10 mg Intramuscular Given 02/13/18 1956)    Initial Impression / Assessment and Plan / UC Course  I have reviewed the triage vital signs and the nursing notes.  Pertinent labs & imaging results that were available during my care of the patient were reviewed by me and considered in my medical decision making (see chart for details).     Hx and exam c/w local reactions to multiple bee stings w/o evidence of anaphylaxis.  Toradol and Decadron given in UC Home care instructions provided below.    Final Clinical Impressions(s) / UC Diagnoses   Final diagnoses:  Sting, bee, accidental or unintentional, initial encounter     Discharge Instructions      You were given a shot of decadron (a steroid) and  toradol (an antiinflammatory) to help decrease pain and swelling. You may take an antihistamine when you get home such as Benadryl, Claritin, Zyrtec, or generic form. Some antihistamines such as benadryl cause drowsiness. Use caution when driving while taking it.  You may use over the counter anti-itch cream such as cortisone to help with the irritation at the site of the sting.  Please follow up with family medicine next week if not improving. Please call 911 or go to the hospital if you develop swelling of lips, tongue, throat, trouble breathing, nausea or  vomiting.     ED Prescriptions    None     Controlled Substance Prescriptions [place] Controlled Substance Registry consulted? Not Applicable   Rolla Platehelps, Destin Vinsant O, PA-C 02/13/18 2007

## 2018-02-13 NOTE — ED Triage Notes (Signed)
Pt c/o multiple yellow jacket stings about 30 mins ago.  Denies allergic reaction in past.

## 2018-02-13 NOTE — Discharge Instructions (Signed)
°  You were given a shot of decadron (a steroid) and toradol (an antiinflammatory) to help decrease pain and swelling. You may take an antihistamine when you get home such as Benadryl, Claritin, Zyrtec, or generic form. Some antihistamines such as benadryl cause drowsiness. Use caution when driving while taking it.  You may use over the counter anti-itch cream such as cortisone to help with the irritation at the site of the sting.  Please follow up with family medicine next week if not improving. Please call 911 or go to the hospital if you develop swelling of lips, tongue, throat, trouble breathing, nausea or vomiting.

## 2018-02-15 ENCOUNTER — Telehealth: Payer: Self-pay | Admitting: Emergency Medicine

## 2018-02-15 NOTE — Telephone Encounter (Signed)
Spoke with patient he states he is doing much better.

## 2018-07-05 IMAGING — DX DG ABDOMEN 1V
2 series · 2 of 2 positions shown · non-contrast
Comparison: None.

CLINICAL DATA: Abdomen pain

EXAM:
ABDOMEN - 1 VIEW

[abdomen kub (1 of 2)]
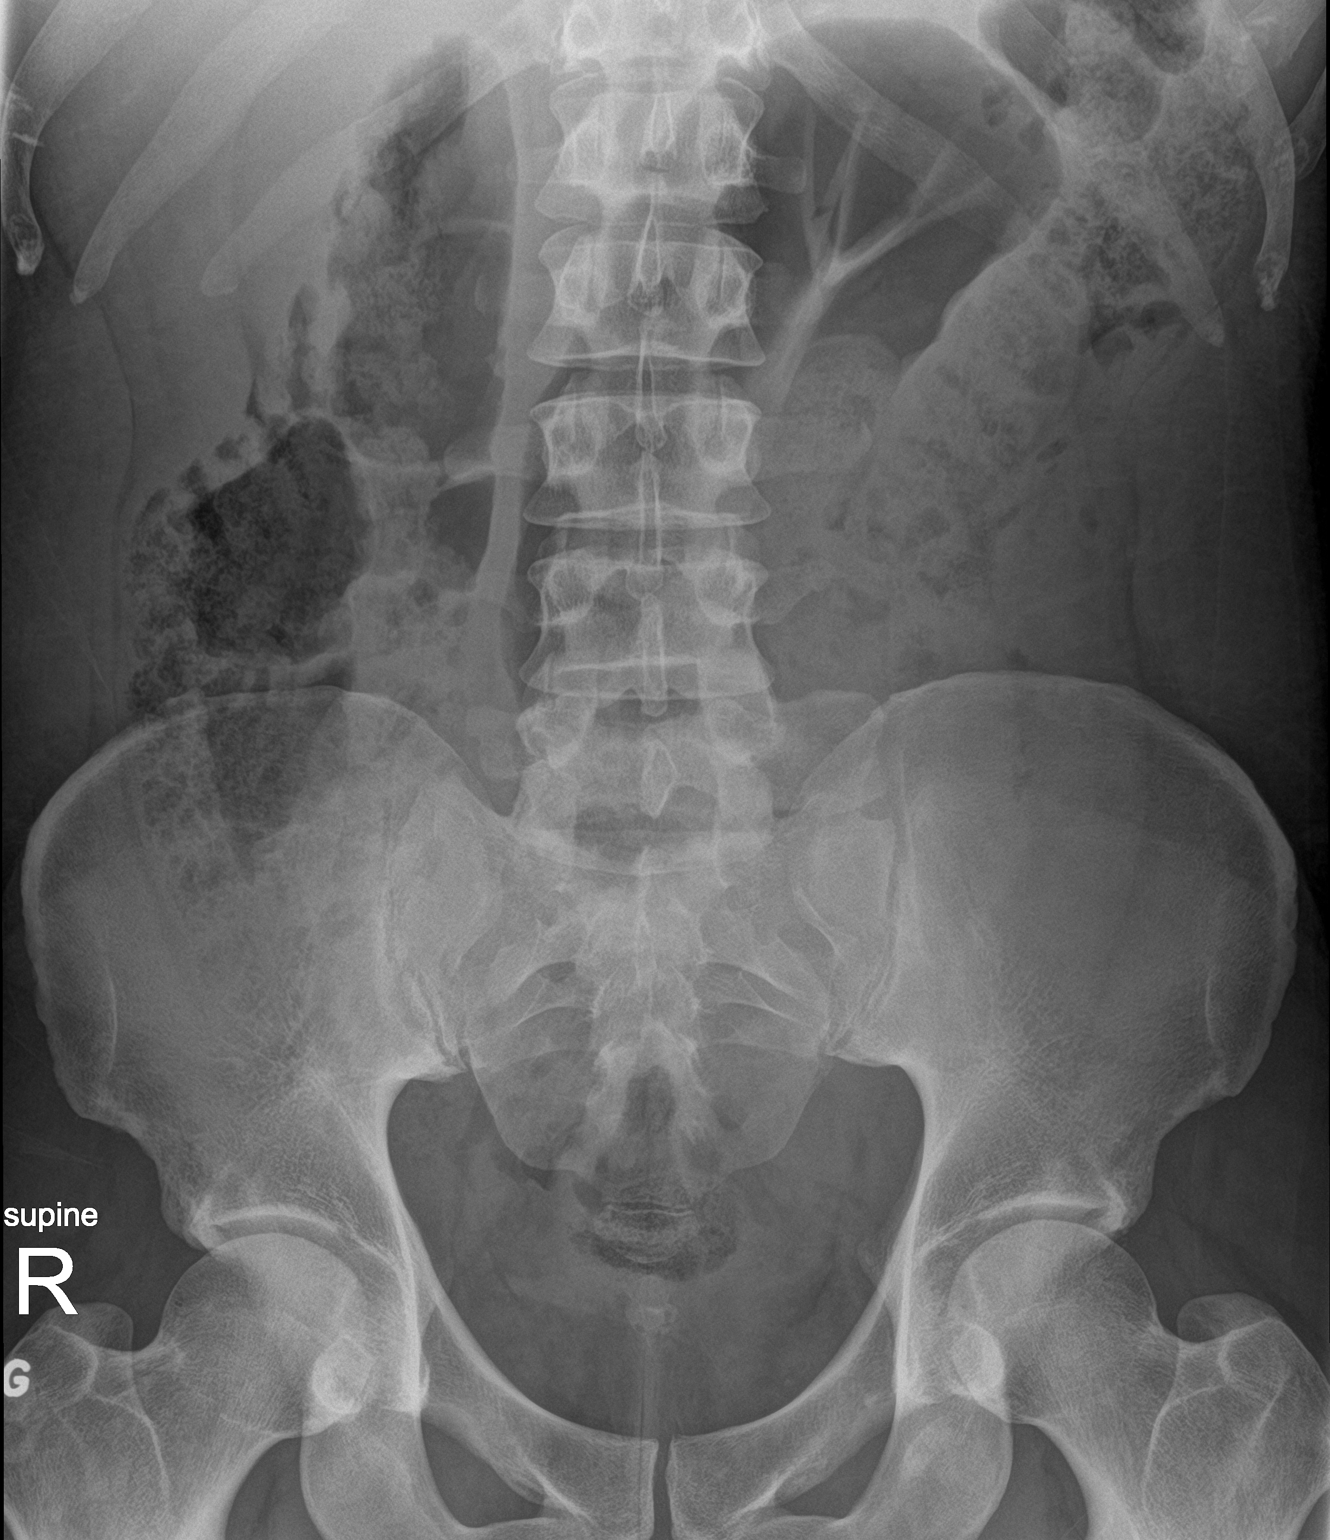

[abdomen kub (2 of 2)]
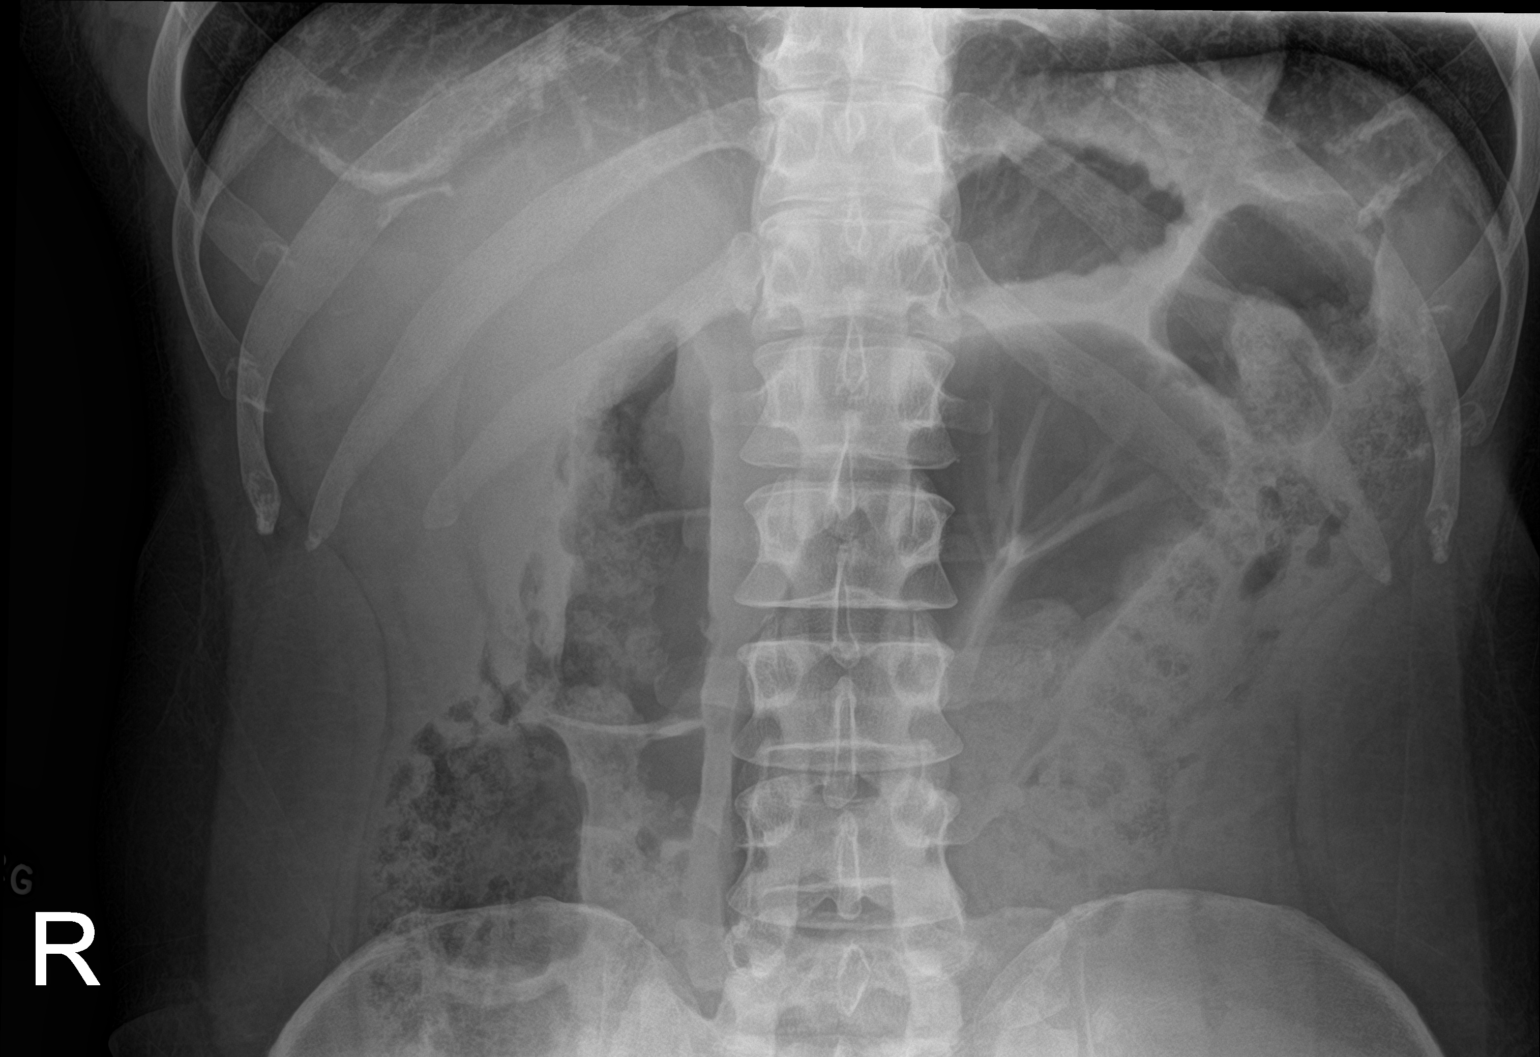

[2 of 2 positions shown; findings below may reference images not displayed]

FINDINGS: Nonobstructed gas pattern with moderate stool in the colon. No
abnormal calcifications.
IMPRESSION: Nonobstructed bowel-gas pattern

## 2018-07-24 DIAGNOSIS — R52 Pain, unspecified: Secondary | ICD-10-CM | POA: Diagnosis not present

## 2018-07-24 DIAGNOSIS — M25562 Pain in left knee: Secondary | ICD-10-CM | POA: Diagnosis not present

## 2018-07-24 DIAGNOSIS — M222X1 Patellofemoral disorders, right knee: Secondary | ICD-10-CM | POA: Diagnosis not present

## 2018-08-28 ENCOUNTER — Encounter: Payer: Self-pay | Admitting: Physician Assistant

## 2018-08-28 ENCOUNTER — Ambulatory Visit (INDEPENDENT_AMBULATORY_CARE_PROVIDER_SITE_OTHER): Payer: BLUE CROSS/BLUE SHIELD | Admitting: Physician Assistant

## 2018-08-28 VITALS — BP 111/64 | HR 58 | Ht 70.0 in | Wt 199.0 lb

## 2018-08-28 DIAGNOSIS — Z130 Encounter for screening for diseases of the blood and blood-forming organs and certain disorders involving the immune mechanism: Secondary | ICD-10-CM

## 2018-08-28 DIAGNOSIS — M545 Low back pain, unspecified: Secondary | ICD-10-CM

## 2018-08-28 DIAGNOSIS — Z7689 Persons encountering health services in other specified circumstances: Secondary | ICD-10-CM

## 2018-08-28 DIAGNOSIS — M25562 Pain in left knee: Secondary | ICD-10-CM

## 2018-08-28 DIAGNOSIS — Z131 Encounter for screening for diabetes mellitus: Secondary | ICD-10-CM

## 2018-08-28 DIAGNOSIS — G8929 Other chronic pain: Secondary | ICD-10-CM | POA: Insufficient documentation

## 2018-08-28 DIAGNOSIS — G479 Sleep disorder, unspecified: Secondary | ICD-10-CM | POA: Diagnosis not present

## 2018-08-28 DIAGNOSIS — Z3009 Encounter for other general counseling and advice on contraception: Secondary | ICD-10-CM

## 2018-08-28 DIAGNOSIS — Z13220 Encounter for screening for lipoid disorders: Secondary | ICD-10-CM

## 2018-08-28 HISTORY — DX: Other chronic pain: G89.29

## 2018-08-28 MED ORDER — NAPROXEN SODIUM 220 MG PO CAPS: 440.0000 mg | ORAL_CAPSULE | Freq: Two times a day (BID) | ORAL | Status: DC | PRN

## 2018-08-28 MED ORDER — HYDROXYZINE HCL 50 MG PO TABS: 25.0000 mg | ORAL_TABLET | Freq: Every evening | ORAL | 0 refills | Status: DC | PRN

## 2018-08-28 NOTE — Progress Notes (Signed)
HPI:                                                                Luke Brown is a 38 y.o. male [facility] today to establish care  Sleep problems: reports nighttime awakening around 2-3 am and takes 2 hours to fall back asleep. Typical bedtime 10 pm, wakes up 5-6am. Reports occasionally his 224 yo daughter wakes him up and he anticipates this happening so he thinks this is why he awakens prematurely. This occurs several days per week. Reports years ago he saw a sleep specialist who just gave him sleep hygiene recommendations; no sleep study was performed. He denies loud, habitual snoring or excessive daytime sleepiness. He has used OTC sleep aids, but not recently. He takes Melatonin occasionally.  States he has a demanding job, a side job, 2 young children and is studying for a certifying exam in March. Overall he feels like he has enough energy to do these things.  LBP: present for years since approx 2010. Pain is midline, worse with prolonged sitting and first waking up in the morning.Marland Kitchen. He has to avoid sleeping on his stomach. He has seen a chiropractor for this and it was helpful. Reports he exercises vigorously 6 days per week. Pain does not interfere with exercise of ADL's. No saddle numbness, bowel/bladder dysfunction or progressive weakness.  Lastly he is interested in a referral for vasectomy.  [facility] 08/28/2018 07/20/2015  Decreased Interest 0 0  Down, Depressed, Hopeless 0 0  PHQ - 2 Score 0 0      Past Medical History:  Diagnosis Date  . Chronic pain of left knee 08/28/2018  . Fracture of arm    History reviewed. No pertinent surgical history. Social History   Tobacco Use  . Smoking status: Never Smoker  . Smokeless tobacco: Never Used  Substance Use Topics  . Alcohol use: No   family history includes Breast cancer in his maternal grandmother; Dementia in his paternal grandmother;  Diabetes in his mother; Kidney disease in his mother; Lung cancer in his paternal grandfather.    ROS: Review of Systems  Musculoskeletal: Positive for back pain and myalgias.  Psychiatric/Behavioral: The patient has insomnia.   All other systems reviewed and are negative.    Medications: Current Outpatient Medications  Medication Sig Dispense Refill  . hydrOXYzine (ATARAX/VISTARIL) 50 MG tablet Take 0.5 tablets (25 mg total) by mouth at bedtime as needed for anxiety (insomnia). For itching. 20 tablet 0  . Naproxen Sodium 220 MG CAPS Take 2 capsules (440 mg total) by mouth every 12 (twelve) hours as needed. 60 each    No current facility-administered medications for this visit.    No Known Allergies     Objective:  BP 111/64   Pulse (!) 58   Ht 5\' 10"  (1.778 m)   Wt 199 lb (90.3 kg)   BMI 28.55 kg/m  Gen:  alert, not ill-appearing, no distress, appropriate for age, athletic  build HEENT: head normocephalic without obvious abnormality, conjunctiva and cornea clear, trachea midline Pulm: Normal work of breathing, normal phonation, clear to auscultation bilaterally, no wheezes, rales or rhonchi CV: bradycardic rate, regular rhythm, s1 and s2 distinct, no murmurs,  clicks or rubs  Neuro: alert and oriented x 3, no tremor MSK: extremities atraumatic, normal gait and station Skin: intact, no rashes on exposed skin, no jaundice, no cyanosis Psych: well-groomed, cooperative, good eye contact, euthymic mood, affect mood-congruent, speech is articulate, and thought processes clear and goal-directed    No results found for this or any previous visit (from the past 72 hour(s)). No results found.    Assessment and Plan: 38 y.o. male with   .[name] was seen today for establish care.  Diagnoses and all orders for this visit:  Encounter to establish care  Chronic midline low back pain without sciatica -     Naproxen Sodium 220 MG CAPS; Take 2 capsules (440 mg total) by mouth  every 12 (twelve) hours as needed.  Sleep disturbance -     hydrOXYzine (ATARAX/VISTARIL) 50 MG tablet; Take 0.5 tablets (25 mg total) by mouth at bedtime as needed for anxiety (insomnia). For itching.  Screening for blood disease -     CBC  Screening for diabetes mellitus -     COMPLETE METABOLIC PANEL WITH GFR  Screening for lipid disorders -     Lipid Panel w/reflex Direct LDL  Vasectomy evaluation -     Ambulatory referral to Urology   - Personally reviewed PMH, PSH, PFH, medications, allergies, HM - Age-appropriate cancer screening: n/a - Influenza declined - Tdap declined - PHQ2 negative  Sleep disturbance with premature awakening Not having a significant effect on mood or energy Recommend avoiding overly sedating medications since he needs to function at a high level  Counseled on sleep hygiene Trial of Hydroyzine  Chronic midline LBP No red flag symptoms Return to chiropractor as needed Recommend Naproxen 1-2 capsules Q12H prn for moderate-severe pain   Patient education and anticipatory guidance given Patient agrees with treatment plan Follow-up in 6 months for annual physical exam or sooner as needed if symptoms worsen or fail to improve  Levonne Hubertharley E. Juwaun Inskeep PA-C

## 2018-08-28 NOTE — Patient Instructions (Addendum)
Sleep Hygiene . Limiting daytime naps to 30 minutes . Napping does not make up for inadequate nighttime sleep. However, a short nap of 20-30 minutes can help to improve mood, alertness and performance.  . Avoiding stimulants such as  caffeine and nicotine close to bedtime.  And when it comes to alcohol, moderation is key 4. While alcohol is well-known to help you fall asleep faster, too much close to bedtime can disrupt sleep in the second half of the night as the body begins to process the alcohol.    . Exercising to promote good quality sleep.  As little as 10 minutes of aerobic exercise, such as walking or cycling, can drastically improve nighttime sleep quality.  For the best night's sleep, most people should avoid strenuous workouts close to bedtime. However, the effect of intense nighttime exercise on sleep differs from person to person, so find out what works best for you.   . Steering clear of food that can be disruptive right before sleep.   Heavy or rich foods, fatty or fried meals, spicy dishes, citrus fruits, and carbonated drinks can trigger indigestion for some people. When this occurs close to bedtime, it can lead to painful heartburn that disrupts sleep. . Ensuring adequate exposure to natural light.  This is particularly important for individuals who may not venture outside frequently. Exposure to sunlight during the day, as well as darkness at night, helps to maintain a healthy sleep-wake cycle . Marland Kitchen Establishing a regular relaxing bedtime routine.  A regular nightly routine helps the body recognize that it is bedtime. This could include taking warm shower or bath, reading a book, or light stretches. When possible, try to avoid emotionally upsetting conversations and activities before attempting to sleep. . Making sure that the sleep environment is pleasant.  Mattress and pillows should be comfortable. The bedroom should be cool - between 60 and 67 degrees - for optimal sleep. Bright light  from lamps, cell phone and TV screens can make it difficult to fall asleep4, so turn those light off or adjust them when possible. Consider using blackout curtains, eye shades, ear plugs, "white noise" machines, humidifiers, fans and other devices that can make the bedroom more relaxing. . Meditation. YouTube Kristopher Glee. There are many smartphone apps as well   Degenerative Disk Disease  Degenerative disk disease is a condition caused by changes that occur in the spinal disks as a person ages. Spinal disks are soft and compressible disks located between the bones of your spine (vertebrae). These disks act like shock absorbers. Degenerative disk disease can affect the whole spine. However, the neck and lower back are most often affected. Many changes can occur in the spinal disks with aging, such as:  The spinal disks may dry and shrink.  Small tears may occur in the tough, outer covering of the disk (annulus).  The disk space may become smaller due to loss of water.  Abnormal growths in the bone (spurs) may occur. This can put pressure on the nerve roots exiting the spinal canal, causing pain.  The spinal canal may become narrowed. What are the causes? This condition may be caused by:  Normal degeneration with age.  Injuries.  Certain activities and sports that cause damage. What increases the risk? The following factors may make you more likely to develop this condition:  Being overweight.  Having a family history of degenerative disk disease.  Smoking.  Sudden injury.  Doing work that requires heavy lifting. What are the signs  or symptoms? Symptoms of this condition include:  Pain that varies in intensity. Some people have no pain, while others have severe pain. The location of the pain depends on the part of your backbone that is affected. You may have: ? Pain in your neck or arm if a disk in your neck area is affected. ? Pain in your back, buttocks, or legs if a disk  in your lower back is affected.  Pain that becomes worse while bending or reaching up, or with twisting movements.  Pain that may start gradually and then get worse as time passes. It may also start after a major or minor injury.  Numbness or tingling in the arms or legs. How is this diagnosed? This condition may be diagnosed based on:  Your symptoms and medical history.  A physical exam.  Imaging tests, including: ? An X-ray of the spine. ? MRI. How is this treated? This condition may be treated with:  Medicines.  Rehabilitation exercises. These activities aim to strengthen muscles in your back and abdomen to better support your spine. If treatments do not help to relieve your symptoms or you have severe pain, you may need surgery. Follow these instructions at home: Medicines  Take over-the-counter and prescription medicines only as told by your health care provider.  Do not drive or use heavy machinery while taking prescription pain medicine.  If you are taking prescription pain medicine, take actions to prevent or treat constipation. Your health care provider may recommend that you: ? Drink enough fluid to keep your urine pale yellow. ? Eat foods that are high in fiber, such as fresh fruits and vegetables, whole grains, and beans. ? Limit foods that are high in fat and processed sugars, such as fried or sweet foods. ? Take an over-the-counter or prescription medicine for constipation. Activity  Rest as told by your health care provider.  Ask your health care provider what activities are safe for you. Return to your normal activities as directed.  Avoid sitting for a long time without moving. Get up to take short walks every 1-2 hours. This is important to improve blood flow and breathing. Ask for help if you feel weak or unsteady.  Perform relaxation exercises as told by your health care provider.  Maintain good posture.  Do not lift anything that is heavier than 10  lb (4.5 kg), or the limit that you are told, until your health care provider says that it is safe.  Follow proper lifting and walking techniques as told by your health care provider. Managing pain, stiffness, and swelling   If directed, put ice on the painful area. Icing can help to relieve pain. ? Put ice in a plastic bag. ? Place a towel between your skin and the bag. ? Leave the ice on for 20 minutes, 2-3 times a day.  If directed, apply heat to the painful area as often as told by your health care provider. Heat can reduce the stiffness of your muscles. Use the heat source that your health care provider recommends, such as a moist heat pack or a heating pad. ? Place a towel between your skin and the heat source. ? Leave the heat on for 20-30 minutes. ? Remove the heat if your skin turns bright red. This is especially important if you are unable to feel pain, heat, or cold. You may have a greater risk of getting burned. General instructions  Change your sitting, standing, and sleeping habits as told by your  health care provider.  Avoid sitting in the same position for long periods of time. Change positions frequently.  Lose weight or maintain a healthy weight as told by your health care provider.  Do not use any products that contain nicotine or tobacco, such as cigarettes and e-cigarettes. If you need help quitting, ask your health care provider.  Wear supportive footwear.  Keep all follow-up visits as told by your health care provider. This is important. This may include visits for physical therapy. Contact a health care provider if you:  Have pain that does not go away within 1-4 weeks.  Lose your appetite.  Lose weight without trying. Get help right away if you:  Have severe pain.  Notice weakness in your arms, hands, or legs.  Begin to lose control of your bladder or bowel movements.  Have fevers or night sweats. Summary  Degenerative disk disease is a condition  caused by changes that occur in the spinal disks as a person ages.  Degenerative disk disease can affect the whole spine. However, the neck and lower back are most often affected.  Take over-the-counter and prescription medicines only as told by your health care provider. This information is not intended to replace advice given to you by your health care provider. Make sure you discuss any questions you have with your health care provider. Document Released: 05/05/2007 Document Revised: 07/03/2017 Document Reviewed: 07/03/2017 Elsevier Interactive Patient Education  2019 [employer]   Vasectomy Vasectomy is a procedure in which the tube that carries sperm from the testicle to the urethra (vas deferens) is tied. It may also be cut. The procedure blocks sperm from going through the vas deferens and penis during ejaculation. This ensures that sperm does not go into the vagina during sex. Vasectomy does not affect your sexual desire or performance, and does not prevent sexually transmitted diseases. Vasectomy is considered a permanent and very effective form of birth control (contraception). The decision to have a vasectomy should not be made during a stressful situation, such as after the loss of a pregnancy or a divorce. You and your partner should make the decision to have a vasectomy when you are sure that you do not want children in the future. Tell a health care provider about:  Any allergies you have.  All medicines you are taking, including vitamins, herbs, eye drops, creams, and over-the-counter medicines.  Any problems you or family members have had with anesthetic medicines.  Any blood disorders you have.  Any surgeries you have had.  Any medical conditions you have. What are the risks? Generally, this is a safe procedure. However, problems may occur, including:  Infection.  Bleeding and swelling of the scrotum.  Allergic reactions to medicines.  Failure of the procedure  to prevent pregnancy. There is a very small chance that the cut ends of the vas deferens may reconnect (recanalization), meaning that you could still make a woman pregnant.  Pain in the scrotum that continues after healing from the procedure. What happens before the procedure?  Ask your health care provider about: ? Changing or stopping your regular medicines. This is especially important if you are taking diabetes medicines or blood thinners. ? Taking over-the-counter medicines, vitamins, herbs, and supplements. ? Taking medicines such as aspirin and ibuprofen. These medicines can thin your blood. Do not take these medicines unless your health care provider tells you to take them.  You may be asked to shower with a germ-killing soap.  Plan to have someone  take you home from the hospital or clinic. What happens during the procedure?   To lower your risk of infection: ? Your health care team will wash or sanitize their hands. ? Hair may be removed from the surgical area. ? Your scrotum will be washed with soap.  You will be given one or more of the following: ? A medicine to help you relax (sedative). You may be instructed to take this a few hours before the procedure. ? A medicine to numb the area (local anesthetic).  Your health care provider will feel (palpate) for your vas deferens.  To reach the vas deferens, one of two methods may be used: ? A very small incision may be made in your scrotum. ? A punctured opening may be made in your scrotum, without an incision.  Your vas deferens will be pulled out of your scrotum, and may be: ? Tied off. ? Cut and possibly burned (cauterized) at the ends to seal them off.  The vas deferens will be put back into your scrotum.  The incision or puncture opening will be closed with absorbable stitches (sutures). The sutures will eventually dissolve and will not need to be removed after the procedure. The procedure may vary among health care  providers and hospitals. What happens after the procedure?  You will be monitored to make sure that you do not experience problems.  You will be asked not to ejaculate for at least 1 week after the procedure, or as long as directed.  You will need to use a different form of contraception for 2-4 months after the procedure, until you have test results confirming that there are no sperm in your semen.  You may be given scrotal support to wear, such as a jock strap or underwear with a supportive pouch.  Do not drive for 24 hours if you were given a sedative to help you relax. Summary  Vasectomy is considered a permanent and very effective form of birth control (contraception). The procedure prevents sperm from being released during ejaculation.  Your scrotum will be numbed with medicine (local anesthetic) for the procedure.  After the procedure, you will be asked not to ejaculate for at least 1 week, or for as long as directed. You will also need to use a different form of contraception until your health care provider examines you and finds that there are no sperm in your semen. This information is not intended to replace advice given to you by your health care provider. Make sure you discuss any questions you have with your health care provider. Document Released: 09/28/2002 Document Revised: 01/09/2018 Document Reviewed: 10/04/2016 Elsevier Interactive Patient Education  2019 [employer].

## 2018-09-03 ENCOUNTER — Encounter: Payer: Self-pay | Admitting: Physician Assistant

## 2018-09-30 DIAGNOSIS — H9202 Otalgia, left ear: Secondary | ICD-10-CM | POA: Diagnosis not present

## 2018-10-29 ENCOUNTER — Encounter: Payer: BLUE CROSS/BLUE SHIELD | Admitting: Physician Assistant

## 2018-12-11 ENCOUNTER — Encounter: Payer: BLUE CROSS/BLUE SHIELD | Admitting: Physician Assistant

## 2019-01-01 ENCOUNTER — Encounter: Payer: BLUE CROSS/BLUE SHIELD | Admitting: Physician Assistant

## 2019-01-07 DIAGNOSIS — Z130 Encounter for screening for diseases of the blood and blood-forming organs and certain disorders involving the immune mechanism: Secondary | ICD-10-CM | POA: Diagnosis not present

## 2019-01-07 DIAGNOSIS — R7989 Other specified abnormal findings of blood chemistry: Secondary | ICD-10-CM | POA: Diagnosis not present

## 2019-01-07 DIAGNOSIS — Z131 Encounter for screening for diabetes mellitus: Secondary | ICD-10-CM | POA: Diagnosis not present

## 2019-01-07 DIAGNOSIS — Z13220 Encounter for screening for lipoid disorders: Secondary | ICD-10-CM | POA: Diagnosis not present

## 2019-01-07 DIAGNOSIS — R748 Abnormal levels of other serum enzymes: Secondary | ICD-10-CM | POA: Diagnosis not present

## 2019-01-08 ENCOUNTER — Encounter: Payer: Self-pay | Admitting: Physician Assistant

## 2019-01-08 ENCOUNTER — Other Ambulatory Visit: Payer: Self-pay

## 2019-01-08 ENCOUNTER — Ambulatory Visit (INDEPENDENT_AMBULATORY_CARE_PROVIDER_SITE_OTHER): Payer: BC Managed Care – PPO | Admitting: Physician Assistant

## 2019-01-08 ENCOUNTER — Telehealth: Payer: Self-pay

## 2019-01-08 VITALS — BP 115/55 | HR 58 | Temp 98.1°F | Wt 202.0 lb

## 2019-01-08 DIAGNOSIS — Z0000 Encounter for general adult medical examination without abnormal findings: Secondary | ICD-10-CM

## 2019-01-08 DIAGNOSIS — M545 Low back pain, unspecified: Secondary | ICD-10-CM

## 2019-01-08 DIAGNOSIS — R748 Abnormal levels of other serum enzymes: Secondary | ICD-10-CM | POA: Diagnosis not present

## 2019-01-08 DIAGNOSIS — Z6828 Body mass index (BMI) 28.0-28.9, adult: Secondary | ICD-10-CM

## 2019-01-08 DIAGNOSIS — R001 Bradycardia, unspecified: Secondary | ICD-10-CM | POA: Diagnosis not present

## 2019-01-08 MED ORDER — NAPROXEN 500 MG PO TABS: 500.0000 mg | ORAL_TABLET | Freq: Two times a day (BID) | ORAL | 0 refills | Status: DC | PRN

## 2019-01-08 NOTE — Progress Notes (Signed)
HPI:                                                                Luke Brown is a 38 y.o. male who presents to [facility] today for annual physical exam  Current Concerns include:  Reports injuring his back while bending and lifting within the last week.  Reports moderate, persistent pain in the middle of his low back.  Pain does not radiate.  Denies saddle numbness, bowel or bladder dysfunction, lower extremity weakness, radicular symptoms.  Health Maintenance Health Maintenance  Topic Date Due  . TETANUS/TDAP  09/04/2019 (Originally 10/30/2000)  . INFLUENZA VACCINE  02/20/2019  . HIV Screening  Discontinued   Depression screen [facility] 2/9 01/08/2019 08/28/2018 07/20/2015  Decreased Interest 0 0 0  Down, Depressed, Hopeless 0 0 0  PHQ - 2 Score 0 0 0  Altered sleeping 1 - -  Tired, decreased energy 0 - -  Change in appetite 0 - -  Feeling bad or failure about yourself  0 - -  Trouble concentrating 0 - -  Moving slowly or fidgety/restless 0 - -  Suicidal thoughts 0 - -  PHQ-9 Score 1 - -   Fall Risk  01/08/2019  Falls in the past year? 0     Past Medical History:  Diagnosis Date  . Chronic pain of left knee 08/28/2018  . Fracture of arm    History reviewed. No pertinent surgical history. Social History   Tobacco Use  . Smoking status: Never Smoker  . Smokeless tobacco: Never Used  Substance Use Topics  . Alcohol use: No   family history includes Breast cancer in his maternal grandmother; Dementia in his paternal grandmother; Diabetes in his mother; Kidney disease in his mother; Lung cancer in his paternal grandfather.  ROS: negative except as noted in the HPI  Medications: Current Outpatient Medications  Medication Sig Dispense Refill  . naproxen (NAPROSYN) 500 MG tablet Take 1 tablet (500 mg total) by mouth 2 (two) times daily as needed for moderate pain. 60 tablet 0   No current facility-administered medications  for this visit.    No Known Allergies     Objective:  BP (!) 115/55   Pulse (!) 58   Temp 98.1 F (36.7 C) (Oral)   Wt 202 lb (91.6 kg)   BMI 28.98 kg/m  General Appearance:  Alert, cooperative, no distress, appropriate for age                            Head:  Normocephalic, without obvious abnormality                             Eyes:  PERRL, EOM's intact, conjunctiva and cornea clear                             Ears:  TM pearly gray color and semitransparent, external ear canals normal, both ears                            Nose:  Nares symmetrical, mucosa pink                          Throat:  Lips, tongue, and mucosa are moist, pink, and intact; oropharynx clear, uvula midline; good dentition                             Neck:  Supple; symmetrical, trachea midline, no adenopathy; thyroid: no enlargement, symmetric, no tenderness/mass/nodules                             Back:  Symmetrical, no curvature, ROM normal, no SI joint tenderness, no midline tenderness                                        Lungs:  Clear to auscultation bilaterally, respirations unlabored                             Heart:  Bradycardic rate & regular rhythm, S1 and S2 normal, no murmurs, rubs, or gallops                     Abdomen:  Soft, non-tender, no mass or organomegaly              Genitourinary:  deferred         Musculoskeletal:  Tone and strength strong and symmetrical, all extremities; no joint pain or edema, normal gait and station                                     Lymphatic:  No adenopathy             Skin/Hair/Nails:  Skin warm, dry and intact, no rashes or abnormal dyspigmentation on limited exam                   Neurologic:  Alert and oriented x3, no cranial nerve deficits, sensation grossly intact, normal gait and station, no tremor Psych: well-groomed, cooperative, good eye contact, euthymic mood, affect mood-congruent, speech is articulate, and thought processes clear and  goal-directed  Recent Results (from the past 2160 hour(s))  CBC     Status: Abnormal   Collection Time: 01/07/19  8:11 AM  Result Value Ref Range   WBC 4.0 3.8 - 10.8 Thousand/uL   RBC 5.07 4.20 - 5.80 Million/uL   Hemoglobin 13.9 13.2 - 17.1 g/dL   HCT 41.7 38.5 - 50.0 %   MCV 82.2 80.0 - 100.0 fL   MCH 27.4 27.0 - 33.0 pg   MCHC 33.3 32.0 - 36.0 g/dL   RDW 13.9 11.0 - 15.0 %   Platelets 160 140 - 400 Thousand/uL   MPV 12.6 (H) 7.5 - 12.5 fL  COMPLETE METABOLIC PANEL WITH GFR     Status: Abnormal   Collection Time: 01/07/19  8:11 AM  Result Value Ref Range   Glucose, Bld 94 65 - 99 mg/dL    Comment: .            Fasting reference interval .    BUN 14 7 - 25 mg/dL   Creat 0.99 0.60 - 1.35 mg/dL   GFR,  Est Non African American 97 > OR = 60 mL/min/1.32m   GFR, Est African American 112 > OR = 60 mL/min/1.783m  BUN/Creatinine Ratio NOT APPLICABLE 6 - 22 (calc)   Sodium 139 135 - 146 mmol/L   Potassium 4.2 3.5 - 5.3 mmol/L   Chloride 105 98 - 110 mmol/L   CO2 29 20 - 32 mmol/L   Calcium 9.3 8.6 - 10.3 mg/dL   Total Protein 6.9 6.1 - 8.1 g/dL   Albumin 4.2 3.6 - 5.1 g/dL   Globulin 2.7 1.9 - 3.7 g/dL (calc)   AG Ratio 1.6 1.0 - 2.5 (calc)   Total Bilirubin 0.6 0.2 - 1.2 mg/dL   Alkaline phosphatase (APISO) 26 (L) 36 - 130 U/L   AST 23 10 - 40 U/L   ALT 14 9 - 46 U/L  Lipid Panel w/reflex Direct LDL     Status: None   Collection Time: 01/07/19  8:11 AM  Result Value Ref Range   Cholesterol 169 <200 mg/dL   HDL 67 > OR = 40 mg/dL   Triglycerides 42 <150 mg/dL   LDL Cholesterol (Calc) 90 mg/dL (calc)    Comment: Reference range: <100 . Desirable range <100 mg/dL for primary prevention;   <70 mg/dL for patients with CHD or diabetic patients  with > or = 2 CHD risk factors. . Marland KitchenDL-C is now calculated using the Martin-Hopkins  calculation, which is a validated novel method providing  better accuracy than the Friedewald equation in the  estimation of LDL-C.  MaCresenciano Genreet al. JAAnnamaria [id number];[phone]  (http://education.[facility].com/faq/FAQ164)    Total CHOL/HDL Ratio 2.5 <5.0 (calc)   Non-HDL Cholesterol (Calc) 102 <130 mg/dL (calc)    Comment: For patients with diabetes plus 1 major ASCVD risk  factor, treating to a non-HDL-C goal of <100 mg/dL  (LDL-C of <70 mg/dL) is considered a therapeutic  option.   VITAMIN D 25 Hydroxy (Vit-D Deficiency, Fractures)     Status: None   Collection Time: 01/07/19  8:11 AM  Result Value Ref Range   Vit D, 25-Hydroxy 34 30 - 100 ng/mL    Comment: Vitamin D Status         25-OH Vitamin D: . Deficiency:                    <20 ng/mL Insufficiency:             20 - 29 ng/mL Optimal:                 > or = 30 ng/mL . For 25-OH Vitamin D testing on patients on  D2-supplementation and patients for whom quantitation  of D2 and D3 fractions is required, the Quest[facility]ureD(TM) 25-OH VIT D, (D2,D3), LC/MS/MS is recommended: order  code [id number] >2y52yr See Note 1 . Note 1 . For additional information, please refer to  http://education.[facility].com/faq/FAQ199  (This link is being provided for informational/ educational purposes only.)   Phosphorus     Status: None   Collection Time: 01/07/19  8:11 AM  Result Value Ref Range   Phosphorus 4.2 2.5 - 4.5 mg/dL  TSH     Status: None   Collection Time: 01/07/19  8:11 AM  Result Value Ref Range   TSH 1.27 0.40 - 4.50 mIU/L  T4, free     Status: None   Collection Time: 01/07/19  8:11 AM  Result Value Ref Range   Free T4 1.3 0.8 - 1.8 ng/dL  TEST  AUTHORIZATION     Status: None   Collection Time: 01/07/19  8:11 AM  Result Value Ref Range   TEST NAME: VITAMIN D,25-OH,TOTAL,IA PHOS    TEST CODE: [id number]    CLIENT CONTACT: EBONY HENRY    REPORT ALWAYS MESSAGE SIGNATURE      Comment: . The laboratory testing on this patient was verbally requested or confirmed by the ordering physician or his or her  authorized representative after contact with an employee of [employer]. Federal regulations require that we maintain on file written authorization for all laboratory testing.  Accordingly we are asking that the ordering physician or his or her authorized representative sign a copy of this report and promptly return it to the client service representative. . . Signature:____________________________________________________ . Please fax this signed page to [phone] or return it via your [employer] courier.     No results found for this or any previous visit (from the past 72 hour(s)). No results found.    Assessment and Plan: 38 y.o. male with   .Jeevan was seen today for annual exam.  Diagnoses and all orders for this visit:  Encounter for annual physical exam  Acute midline low back pain without sciatica -     naproxen (NAPROSYN) 500 MG tablet; Take 1 tablet (500 mg total) by mouth 2 (two) times daily as needed for moderate pain.  Low serum alkaline phosphatase -     TSH + free T4 -     VITAMIN D 25 Hydroxy (Vit-D Deficiency, Fractures) -     Phosphorus  Bradycardia with 51-60 beats per minute  Adult BMI 28.0-28.9 kg/sq m   - Personally reviewed PMH, PSH, PFH, medications, allergies, HM - Personally reviewed lab results with patient. TSH, Vitamin D and Phos added on for low alk phos level - Age-appropriate cancer screening: n/a - Tdap UTD - PHQ2 negative - Fall screen negative - BMI 28-counseled on therapeutic lifestyle changes  Acute low back pain No red flag symptoms Counseled on conservative management including relative rest, home rehab exercises/gentle stretching, anti-inflammatory as needed Return precautions discussed   Patient education and anticipatory guidance given Patient agrees with treatment plan Follow-up in 1 year for CPE or sooner as needed  Darlyne Russian PA-C

## 2019-01-08 NOTE — Telephone Encounter (Signed)
Spoke with CSR: Rudell Cobb. Additional labs added. -EH/RMA

## 2019-01-08 NOTE — Patient Instructions (Signed)
Preventive Care 18-39 Years, Male Preventive care refers to lifestyle choices and visits with your health care provider that can promote health and wellness. What does preventive care include?   A yearly physical exam. This is also called an annual well check.  Dental exams once or twice a year.  Routine eye exams. Ask your health care provider how often you should have your eyes checked.  Personal lifestyle choices, including: ? Daily care of your teeth and gums. ? Regular physical activity. ? Eating a healthy diet. ? Avoiding tobacco and drug use. ? Limiting alcohol use. ? Practicing safe sex. What happens during an annual well check? The services and screenings done by your health care provider during your annual well check will depend on your age, overall health, lifestyle risk factors, and family history of disease. Counseling Your health care provider may ask you questions about your:  Alcohol use.  Tobacco use.  Drug use.  Emotional well-being.  Home and relationship well-being.  Sexual activity.  Eating habits.  Work and work environment. Screening You may have the following tests or measurements:  Height, weight, and BMI.  Blood pressure.  Lipid and cholesterol levels. These may be checked every 5 years starting at age 20.  Diabetes screening. This is done by checking your blood sugar (glucose) after you have not eaten for a while (fasting).  Skin check.  Hepatitis C blood test.  Hepatitis B blood test.  Sexually transmitted disease (STD) testing. Discuss your test results, treatment options, and if necessary, the need for more tests with your health care provider. Vaccines Your health care provider may recommend certain vaccines, such as:  Influenza vaccine. This is recommended every year.  Tetanus, diphtheria, and acellular pertussis (Tdap, Td) vaccine. You may need a Td booster every 10 years.  Varicella vaccine. You may need this if you  have not been vaccinated.  HPV vaccine. If you are 26 or younger, you may need three doses over 6 months.  Measles, mumps, and rubella (MMR) vaccine. You may need at least one dose of MMR.You may also need a second dose.  Pneumococcal 13-valent conjugate (PCV13) vaccine. You may need this if you have certain conditions and have not been vaccinated.  Pneumococcal polysaccharide (PPSV23) vaccine. You may need one or two doses if you smoke cigarettes or if you have certain conditions.  Meningococcal vaccine. One dose is recommended if you are age 19-21 years and a first-year college student living in a residence hall, or if you have one of several medical conditions. You may also need additional booster doses.  Hepatitis A vaccine. You may need this if you have certain conditions or if you travel or work in places where you may be exposed to hepatitis A.  Hepatitis B vaccine. You may need this if you have certain conditions or if you travel or work in places where you may be exposed to hepatitis B.  Haemophilus influenzae type b (Hib) vaccine. You may need this if you have certain risk factors. Talk to your health care provider about which screenings and vaccines you need and how often you need them. This information is not intended to replace advice given to you by your health care provider. Make sure you discuss any questions you have with your health care provider. Document Released: 09/03/2001 Document Revised: 02/18/2017 Document Reviewed: 05/09/2015 Elsevier Interactive Patient Education  2019 Elsevier Inc[employer]   

## 2019-01-09 LAB — LIPID PANEL W/REFLEX DIRECT LDL
Cholesterol: 169 mg/dL (ref <200)
HDL: 67 mg/dL (ref >=40)
LDL CHOLESTEROL (CALC): 90 mg/dL
Non-HDL Cholesterol (Calc): 102 mg/dL (calc) (ref <130)
Total CHOL/HDL Ratio: 2.5 (calc) (ref <5.0)
Triglycerides: 42 mg/dL (ref <150)

## 2019-01-09 LAB — CBC
HCT: 41.7 % (ref 38.5–50.0)
Hemoglobin: 13.9 g/dL (ref 13.2–17.1)
MCH: 27.4 pg (ref 27.0–33.0)
MCHC: 33.3 g/dL (ref 32.0–36.0)
MCV: 82.2 fL (ref 80.0–100.0)
MPV: 12.6 fL — ABNORMAL HIGH (ref 7.5–12.5)
Platelets: 160 10*3/uL (ref 140–400)
RBC: 5.07 10*6/uL (ref 4.20–5.80)
RDW: 13.9 % (ref 11.0–15.0)
WBC: 4 10*3/uL (ref 3.8–10.8)

## 2019-01-09 LAB — TEST AUTHORIZATION

## 2019-01-09 LAB — TSH: TSH: 1.27 mIU/L (ref 0.40–4.50)

## 2019-01-09 LAB — COMPLETE METABOLIC PANEL WITH GFR
AG Ratio: 1.6 (calc) (ref 1.0–2.5)
ALBUMIN MSPROF: 4.2 g/dL (ref 3.6–5.1)
ALT: 14 U/L (ref 9–46)
AST: 23 U/L (ref 10–40)
Alkaline phosphatase (APISO): 26 U/L — ABNORMAL LOW (ref 36–130)
BUN: 14 mg/dL (ref 7–25)
CO2: 29 mmol/L (ref 20–32)
Calcium: 9.3 mg/dL (ref 8.6–10.3)
Chloride: 105 mmol/L (ref 98–110)
Creat: 0.99 mg/dL (ref 0.60–1.35)
GFR, EST AFRICAN AMERICAN: 112 mL/min/{1.73_m2} (ref >=60)
GFR, Est Non African American: 97 mL/min/{1.73_m2} (ref >=60)
Globulin: 2.7 g/dL (calc) (ref 1.9–3.7)
Glucose, Bld: 94 mg/dL (ref 65–99)
Potassium: 4.2 mmol/L (ref 3.5–5.3)
Sodium: 139 mmol/L (ref 135–146)
Total Bilirubin: 0.6 mg/dL (ref 0.2–1.2)
Total Protein: 6.9 g/dL (ref 6.1–8.1)

## 2019-01-09 LAB — VITAMIN D 25 HYDROXY (VIT D DEFICIENCY, FRACTURES): Vit D, 25-Hydroxy: 34 ng/mL (ref 30–100)

## 2019-01-09 LAB — PHOSPHORUS: Phosphorus: 4.2 mg/dL (ref 2.5–4.5)

## 2019-01-09 LAB — T4, FREE: Free T4: 1.3 ng/dL (ref 0.8–1.8)

## 2019-01-11 NOTE — Progress Notes (Signed)
Error -EH/RMA  

## 2019-01-24 ENCOUNTER — Encounter: Payer: Self-pay | Admitting: Physician Assistant

## 2019-01-24 DIAGNOSIS — R748 Abnormal levels of other serum enzymes: Secondary | ICD-10-CM | POA: Insufficient documentation

## 2019-01-24 DIAGNOSIS — Z6828 Body mass index (BMI) 28.0-28.9, adult: Secondary | ICD-10-CM | POA: Insufficient documentation

## 2019-01-24 DIAGNOSIS — M545 Low back pain, unspecified: Secondary | ICD-10-CM | POA: Insufficient documentation

## 2019-01-24 DIAGNOSIS — R001 Bradycardia, unspecified: Secondary | ICD-10-CM | POA: Insufficient documentation

## 2019-05-03 DIAGNOSIS — F4322 Adjustment disorder with anxiety: Secondary | ICD-10-CM | POA: Diagnosis not present

## 2019-05-06 ENCOUNTER — Telehealth: Payer: Self-pay

## 2019-05-06 NOTE — Telephone Encounter (Signed)
This was offered, patient declined

## 2019-05-06 NOTE — Telephone Encounter (Signed)
OK to offer earlier appt if pt desires / is able, I see Dr Georgina Snell has an acute slot open this afternoon

## 2019-05-06 NOTE — Telephone Encounter (Signed)
Patient called triage with some complaints of increased anxiety for 1-2 weeks with some chest pain. Patient denies any other SX other than some tightness. 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 Pressure/tightness in chest is new (within last 2 weeks) and also concerned because he has gained 20 pounds.  Patient wanting to be seen in office for SX. There was a string hint at patient wanting some paperwork filled out to excuse him from work. Luke Brown patient, not yet established.   Patient on Dr Redgie Grayer schedule for tomorrow at 8:30 to discuss current SX. Patient made aware several times that this visit is only to address current SX- not to establish care. Patient states he understands. Also advised patient that if any new or worsening SX he is to be seen urgently at either ER or Urgent Care.   FYI to Dr Sheppard Coil. Anything further you would like for me to do with this pt?

## 2019-05-07 ENCOUNTER — Other Ambulatory Visit: Payer: Self-pay

## 2019-05-07 ENCOUNTER — Ambulatory Visit (INDEPENDENT_AMBULATORY_CARE_PROVIDER_SITE_OTHER): Payer: BC Managed Care – PPO | Admitting: Osteopathic Medicine

## 2019-05-07 ENCOUNTER — Encounter: Payer: Self-pay | Admitting: Osteopathic Medicine

## 2019-05-07 VITALS — BP 118/69 | HR 60 | Temp 97.8°F | Ht 70.0 in | Wt 212.2 lb

## 2019-05-07 DIAGNOSIS — F418 Other specified anxiety disorders: Secondary | ICD-10-CM

## 2019-05-07 MED ORDER — OMEPRAZOLE 40 MG PO CPDR: 40.0000 mg | DELAYED_RELEASE_CAPSULE | Freq: Every day | ORAL | 1 refills | Status: DC

## 2019-05-07 NOTE — Patient Instructions (Signed)
Plan:   Will trial course of daily antacid medications  Can keep bottle of Tums / Rolaids on hand to take as needed if you feel the chest lump come up.   Consider short-acting anti-anxiety medications as needed. I don't think we need daily long term anti-anxiety medications, at least not now, but if symptoms persist for 3+ months we should consider this.   Will hopefully be able to get time off!   Call/message [place] or seek emergency care if needed if symptoms change/worsen.
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
## 2019-05-17 ENCOUNTER — Telehealth: Payer: Self-pay

## 2019-05-17 NOTE — Telephone Encounter (Signed)
I don't think I've received anything from his human resources department, he needs to initiate this process with his employer and they will send it to me directly. I know we discussed it but I don't recall him bringing me any paperwork? I'll double check

## 2019-05-17 NOTE — Telephone Encounter (Signed)
Pt called requesting an update regarding FMLA paperwork he submitted at his last visit. As per pt, will need to hand in the form to his employer soon. Pls advise, thanks.

## 2019-05-17 NOTE — Telephone Encounter (Signed)
Just kidding, I found the forms and finished everything, should be ready to send

## 2019-05-18 NOTE — Telephone Encounter (Signed)
Task has been completed. FMLA form faxed to [phone]. Confirmation rec'd. Pt has been updated and is aware original form will be available for p/u at front desk. Copy of FMLA form placed in scan folder. No other inquiries during call.

## 2019-05-21 ENCOUNTER — Ambulatory Visit: Payer: BC Managed Care – PPO | Admitting: Osteopathic Medicine

## 2019-05-28 ENCOUNTER — Ambulatory Visit: Payer: BC Managed Care – PPO | Admitting: Osteopathic Medicine

## 2019-06-03 ENCOUNTER — Encounter: Payer: Self-pay | Admitting: Osteopathic Medicine

## 2019-06-03 ENCOUNTER — Other Ambulatory Visit: Payer: Self-pay

## 2019-06-03 ENCOUNTER — Ambulatory Visit (INDEPENDENT_AMBULATORY_CARE_PROVIDER_SITE_OTHER): Payer: BC Managed Care – PPO | Admitting: Osteopathic Medicine

## 2019-06-03 VITALS — BP 132/71 | HR 60 | Temp 98.1°F | Wt 214.2 lb

## 2019-06-03 DIAGNOSIS — K219 Gastro-esophageal reflux disease without esophagitis: Secondary | ICD-10-CM

## 2019-06-03 DIAGNOSIS — R079 Chest pain, unspecified: Secondary | ICD-10-CM

## 2019-06-03 DIAGNOSIS — F411 Generalized anxiety disorder: Secondary | ICD-10-CM

## 2019-06-03 DIAGNOSIS — F418 Other specified anxiety disorders: Secondary | ICD-10-CM

## 2019-06-03 MED ORDER — OMEPRAZOLE 40 MG PO CPDR: DELAYED_RELEASE_CAPSULE | ORAL | 1 refills | Status: DC

## 2019-06-03 NOTE — Patient Instructions (Signed)
Will try higher dose Omeprazole for a month, then back down to once daily then off!    Food Choices for Gastroesophageal Reflux Disease, Adult When you have gastroesophageal reflux disease (GERD), the foods you eat and your eating habits are very important. Choosing the right foods can help ease the discomfort of GERD. Consider working with a diet and nutrition specialist (dietitian) to help you make healthy food choices. What general guidelines should I follow?  Eating plan  Choose healthy foods low in fat, such as fruits, vegetables, whole grains, low-fat dairy products, and lean meat, fish, and poultry.  Eat frequent, small meals instead of three large meals each day. Eat your meals slowly, in a relaxed setting. Avoid bending over or lying down until 2-3 hours after eating.  Limit high-fat foods such as fatty meats or fried foods.  Limit your intake of oils, butter, and shortening to less than 8 teaspoons each day.  Avoid the following: ? Foods that cause symptoms. These may be different for different people. Keep a food diary to keep track of foods that cause symptoms. ? Alcohol. ? Drinking large amounts of liquid with meals. ? Eating meals during the 2-3 hours before bed.  Cook foods using methods other than frying. This may include baking, grilling, or broiling. Lifestyle  Maintain a healthy weight. Ask your health care provider what weight is healthy for you. If you need to lose weight, work with your health care provider to do so safely.  Exercise for at least 30 minutes on 5 or more days each week, or as told by your health care provider.  Avoid wearing clothes that fit tightly around your waist and chest.  Do not use any products that contain nicotine or tobacco, such as cigarettes and e-cigarettes. If you need help quitting, ask your health care provider.  Sleep with the head of your bed raised. Use a wedge under the mattress or blocks under the bed frame to raise the  head of the bed. What foods are not recommended? The items listed may not be a complete list. Talk with your dietitian about what dietary choices are best for you. Grains Pastries or quick breads with added fat. [place] toast. Vegetables Deep fried vegetables. [place] fries. Any vegetables prepared with added fat. Any vegetables that cause symptoms. For some people this may include tomatoes and tomato products, chili peppers, onions and garlic, and horseradish. Fruits Any fruits prepared with added fat. Any fruits that cause symptoms. For some people this may include citrus fruits, such as oranges, grapefruit, pineapple, and lemons. Meats and other protein foods High-fat meats, such as fatty beef or pork, hot dogs, ribs, ham, sausage, salami and bacon. Fried meat or protein, including fried fish and fried chicken. Nuts and nut butters. Dairy Whole milk and chocolate milk. Sour cream. Cream. Ice cream. Cream cheese. Milk shakes. Beverages Coffee and tea, with or without caffeine. Carbonated beverages. Sodas. Energy drinks. Fruit juice made with acidic fruits (such as orange or grapefruit). Tomato juice. Alcoholic drinks. Fats and oils Butter. Margarine. Shortening. Ghee. Sweets and desserts Chocolate and cocoa. Donuts. Seasoning and other foods Pepper. Peppermint and spearmint. Any condiments, herbs, or seasonings that cause symptoms. For some people, this may include curry, hot sauce, or vinegar-based salad dressings. Summary  When you have gastroesophageal reflux disease (GERD), food and lifestyle choices are very important to help ease the discomfort of GERD.  Eat frequent, small meals instead of three large meals each day. Eat your meals  slowly, in a relaxed setting. Avoid bending over or lying down until 2-3 hours after eating.  Limit high-fat foods such as fatty meat or fried foods. This information is not intended to replace advice given to you by your health care provider. Make sure  you discuss any questions you have with your health care provider. Document Released: 07/08/2005 Document Revised: 10/29/2018 Document Reviewed: 07/09/2016 Elsevier Patient Education  2020 [employer].

## 2019-06-03 NOTE — Progress Notes (Signed)
HPI: Luke Brown is a 38 y.o. male who  has a past medical history of Chronic pain of left knee (08/28/2018) and Fracture of arm.  he presents to [facility] today, 06/03/19,  for chief complaint of:  Recheck anxiety/chest pain/acid reflux  Last visit 05/07/2019 was having some issues with some atypical/nonspecific chest pain.  He attributed this mostly to anxiety.  He notes that it also gets worse with coffee or spicy foods.  He states that since starting the omeprazole and since being off of work for some time and participating in therapy, the pain is much better, still having rare episodes.  He is curious whether going up on the antacid medication might be helpful.  He has some questions about possible dietary modifications that might also help. .    At today's visit 06/03/19 ... PMH, PSH, FH reviewed and updated as needed.  Current medication list and allergy/intolerance hx reviewed and updated as needed. (See remainder of HPI, ROS, Phys Exam below)   No results found.  No results found for this or any previous visit (from the past 72 hour(s)).        ASSESSMENT/PLAN: The primary encounter diagnosis was Nonspecific chest pain. Diagnoses of Gastroesophageal reflux disease, unspecified whether esophagitis present, Anxiety state, and Anxiety with somatic features were also pertinent to this visit.    Meds ordered this encounter  Medications  . omeprazole (PRILOSEC) 40 MG capsule    Sig: Take 1 capsule (40 mg total) by mouth 2 (two) times daily for 30 days, THEN 1 capsule (40 mg total) daily.    Dispense:  90 capsule    Refill:  1    Patient Instructions  Will try higher dose Omeprazole for a month, then back down to once daily then off!    Food Choices for Gastroesophageal Reflux Disease, Adult When you have gastroesophageal reflux disease (GERD), the foods you eat and your eating habits are very important. Choosing the right foods can  help ease the discomfort of GERD. Consider working with a diet and nutrition specialist (dietitian) to help you make healthy food choices. What general guidelines should I follow?  Eating plan  Choose healthy foods low in fat, such as fruits, vegetables, whole grains, low-fat dairy products, and lean meat, fish, and poultry.  Eat frequent, small meals instead of three large meals each day. Eat your meals slowly, in a relaxed setting. Avoid bending over or lying down until 2-3 hours after eating.  Limit high-fat foods such as fatty meats or fried foods.  Limit your intake of oils, butter, and shortening to less than 8 teaspoons each day.  Avoid the following: ? Foods that cause symptoms. These may be different for different people. Keep a food diary to keep track of foods that cause symptoms. ? Alcohol. ? Drinking large amounts of liquid with meals. ? Eating meals during the 2-3 hours before bed.  Cook foods using methods other than frying. This may include baking, grilling, or broiling. Lifestyle  Maintain a healthy weight. Ask your health care provider what weight is healthy for you. If you need to lose weight, work with your health care provider to do so safely.  Exercise for at least 30 minutes on 5 or more days each week, or as told by your health care provider.  Avoid wearing clothes that fit tightly around your waist and chest.  Do not use any products that contain nicotine or tobacco, such as cigarettes and e-cigarettes.  If you need help quitting, ask your health care provider.  Sleep with the head of your bed raised. Use a wedge under the mattress or blocks under the bed frame to raise the head of the bed. What foods are not recommended? The items listed may not be a complete list. Talk with your dietitian about what dietary choices are best for you. Grains Pastries or quick breads with added fat. [place] toast. Vegetables Deep fried vegetables. [place] fries. Any vegetables  prepared with added fat. Any vegetables that cause symptoms. For some people this may include tomatoes and tomato products, chili peppers, onions and garlic, and horseradish. Fruits Any fruits prepared with added fat. Any fruits that cause symptoms. For some people this may include citrus fruits, such as oranges, grapefruit, pineapple, and lemons. Meats and other protein foods High-fat meats, such as fatty beef or pork, hot dogs, ribs, ham, sausage, salami and bacon. Fried meat or protein, including fried fish and fried chicken. Nuts and nut butters. Dairy Whole milk and chocolate milk. Sour cream. Cream. Ice cream. Cream cheese. Milk shakes. Beverages Coffee and tea, with or without caffeine. Carbonated beverages. Sodas. Energy drinks. Fruit juice made with acidic fruits (such as orange or grapefruit). Tomato juice. Alcoholic drinks. Fats and oils Butter. Margarine. Shortening. Ghee. Sweets and desserts Chocolate and cocoa. Donuts. Seasoning and other foods Pepper. Peppermint and spearmint. Any condiments, herbs, or seasonings that cause symptoms. For some people, this may include curry, hot sauce, or vinegar-based salad dressings. Summary  When you have gastroesophageal reflux disease (GERD), food and lifestyle choices are very important to help ease the discomfort of GERD.  Eat frequent, small meals instead of three large meals each day. Eat your meals slowly, in a relaxed setting. Avoid bending over or lying down until 2-3 hours after eating.  Limit high-fat foods such as fatty meat or fried foods. This information is not intended to replace advice given to you by your health care provider. Make sure you discuss any questions you have with your health care provider. Document Released: 07/08/2005 Document Revised: 10/29/2018 Document Reviewed: 07/09/2016 Elsevier Patient Education  [place].      Follow-up plan: Return if symptoms worsen or fail to improve.                                                  ################################################# ################################################# ################################################# #################################################    Current Meds  Medication Sig  . omeprazole (PRILOSEC) 40 MG capsule Take 1 capsule (40 mg total) by mouth 2 (two) times daily for 30 days, THEN 1 capsule (40 mg total) daily.  . [DISCONTINUED] omeprazole (PRILOSEC) 40 MG capsule Take 1 capsule (40 mg total) by mouth daily.    No Known Allergies     Review of Systems:  Constitutional: No recent illness  HEENT: No  headache, no vision change  Cardiac: +chest pain per HPI, No  pressure, No palpitations  Respiratory:  No  shortness of breath. No  Cough  Gastrointestinal: No  abdominal pain  Musculoskeletal: No new myalgia/arthralgia  Skin: No  Rash  Neurologic: No  weakness, No  Dizziness  Psychiatric: No  concerns with depression, +concerns with anxiety but better  Exam:  BP 132/71 (BP Location: Right Arm, Patient Position: Sitting, Cuff Size: Normal)   Pulse 60   Temp 98.1 F (36.7 C) (Oral)  Wt 214 lb 3.2 oz (97.2 kg)   BMI 30.73 kg/m   Constitutional: VS see above. General Appearance: alert, well-developed, well-nourished, NAD  Eyes: Normal lids and conjunctive, non-icteric sclera  Neck: No masses, trachea midline.   Respiratory: Normal respiratory effort.  Musculoskeletal: Gait normal. Symmetric and independent movement of all extremities  Neurological: Normal balance/coordination. No tremor.  Skin: warm, dry, intact.   Psychiatric: Normal judgment/insight. Normal mood and affect. Oriented x3.       Visit summary with medication list and pertinent instructions was printed for patient to review, patient was advised to alert us if any updates are needed. All questions at time of visit were answered - patient instructed to  contact office with any additional concerns. ER/RTC precautions were reviewed with the patient and understanding verbalized.   Note: Total time spent 25 minutes, greater than 50% of the visit was spent face-to-face counseling and coordinating care for the following: The primary encounter diagnosis was Nonspecific chest pain. Diagnoses of Gastroesophageal reflux disease, unspecified whether esophagitis present, Anxiety state, and Anxiety with somatic features were also pertinent to this visit.Marland Kitchen.  Please note: voice recognition software was used to produce this document, and typos may escape review. Please contact Dr. Lyn HollingsheadAlexander for any needed clarifications.    Follow up plan: Return if symptoms worsen or fail to improve.

## 2019-06-04 ENCOUNTER — Telehealth: Payer: Self-pay | Admitting: Physician Assistant

## 2019-06-04 NOTE — Telephone Encounter (Signed)
Dr. A patient.

## 2019-06-04 NOTE — Telephone Encounter (Signed)
[facility] is not covering the lab done from 01/08/19 visit for Vitamin D. Here is a list they will cover  Covered codes -   E21.0 Primary hyperparathyroidism E21.1 Secondary hyperparathyroidism, not elsewhere classified E21.3 Hyperparathyroidism, unspecified E55.9 Vitamin D deficiency, unspecified E83.51 Hypocalcemia E83.52 Hypercalcemia E83.59 Other disorders of calcium metabolism M60.89 Other myositis, multiple sites M79.1 Myalgia M79.7 Fibromyalgia M81.0 Age-related osteoporosis without current pathological fracture M81.8 Other osteoporosis without current pathological fracture M85.80 Other specified disorders of bone density and structure, unspecified site M85.89 Other specified disorders of bone density and structure, multiple sites M85.9 Disorder of bone density and structure, unspecified M89.9 Disorder of bone, unspecified N18.3 Chronic kidney disease, stage 3 (moderate) N18.4 Chronic kidney disease, stage 4 (severe) N25.81 Secondary hyperparathyroidism of renal origin Z79.899 Other long term (current) drug therapy

## 2019-06-09 NOTE — Telephone Encounter (Signed)
Evlyn Clines initially ordered these labs, I'm not sure of her rationale for the vitamin d. Looks like he has history of low back pain, can try diagnosis Myalgia M79.1

## 2019-06-15 NOTE — Telephone Encounter (Signed)
Diagnosis code entered into [employer] trailer. KG LPN

## 2020-02-16 ENCOUNTER — Emergency Department (INDEPENDENT_AMBULATORY_CARE_PROVIDER_SITE_OTHER)
Admission: EM | Admit: 2020-02-16 | Discharge: 2020-02-16 | Disposition: A | Discharge status: Home / Self Care | Payer: BC Managed Care – PPO | Source: Home / Self Care | Attending: Family Medicine | Admitting: Family Medicine

## 2020-02-16 DIAGNOSIS — Z23 Encounter for immunization: Secondary | ICD-10-CM | POA: Diagnosis not present

## 2020-02-16 DIAGNOSIS — S0101XA Laceration without foreign body of scalp, initial encounter: Secondary | ICD-10-CM | POA: Diagnosis not present

## 2020-02-16 MED ORDER — TETANUS-DIPHTH-ACELL PERTUSSIS 5-2.5-18.5 LF-MCG/0.5 IM SUSP
0.5000 mL | Freq: Once | INTRAMUSCULAR | Status: AC
  Administered 2020-02-16: 0.5 mL via INTRAMUSCULAR

## 2020-02-16 NOTE — ED Provider Notes (Signed)
[facility]    CSN: [id number] Arrival date & time: 02/16/20  1900      History   Chief Complaint Chief Complaint  Patient presents with  . Head Laceration    HPI Luke Brown is a 39 y.o. male.   Patient lacerated his scalp about an hour ago when he stood up and hit a light fixture.  He denies headache, loss of consciousness, and neurologic symptoms.  He is not sure when he last received a Tdap.   The history is provided by the patient.  Head Laceration This is a new problem. The current episode started 1 to 2 hours ago. The problem occurs constantly. The problem has not changed since onset.Pertinent negatives include no headaches. Nothing relieves the symptoms. He has tried nothing for the symptoms.    Past Medical History:  Diagnosis Date  . Chronic pain of left knee 08/28/2018  . Fracture of arm     Patient Active Problem List   Diagnosis Date Noted  . Acute midline low back pain without sciatica 01/24/2019  . Low serum alkaline phosphatase 01/24/2019  . Bradycardia with 51-60 beats per minute 01/24/2019  . Adult BMI 28.0-28.9 kg/sq m 01/24/2019  . Chronic pain of left knee 08/28/2018  . Chronic midline low back pain without sciatica 08/28/2018  . Sleep disturbance 08/28/2018  . Acne keloidalis nuchae 09/29/2017  . Keratosis pilaris 09/29/2017  . Hyperpigmentation 09/29/2017    History reviewed. No pertinent surgical history.     Home Medications    Prior to Admission medications   Medication Sig Start Date End Date Taking? Authorizing Provider  omeprazole (PRILOSEC) 40 MG capsule Take 1 capsule (40 mg total) by mouth 2 (two) times daily for 30 days, THEN 1 capsule (40 mg total) daily. 06/03/19 08/02/19  Sunnie Nielsen, DO    Family History Family History  Problem Relation Age of Onset  . Diabetes Mother   . Kidney disease Mother   . Breast cancer Maternal Grandmother   . Dementia Paternal Grandmother   . Lung cancer Paternal  Grandfather     Social History Social History   Tobacco Use  . Smoking status: Never Smoker  . Smokeless tobacco: Never Used  Vaping Use  . Vaping Use: Never used  Substance Use Topics  . Alcohol use: No  . Drug use: Never     Allergies   Patient has no known allergies.   Review of Systems Review of Systems  Constitutional: Negative for activity change.  Neurological: Negative for dizziness, syncope, weakness, light-headedness and headaches.  All other systems reviewed and are negative.    Physical Exam Triage Vital Signs ED Triage Vitals  Enc Vitals Group     BP 02/16/20 1911 116/77     Pulse Rate 02/16/20 1911 64     Resp 02/16/20 1911 16     Temp 02/16/20 1911 99.3 F (37.4 C)     Temp Source 02/16/20 1911 Oral     SpO2 02/16/20 1911 99 %     Weight --      Height --      Head Circumference --      Peak Flow --      Pain Score 02/16/20 1909 2     Pain Loc --      Pain Edu? --      Excl. in GC? --    No data found.  Updated Vital Signs BP 116/77 (BP Location: Left Arm)   Pulse 64  Temp 99.3 F (37.4 C) (Oral)   Resp 16   SpO2 99%   Visual Acuity Right Eye Distance:   Left Eye Distance:   Bilateral Distance:    Right Eye Near:   Left Eye Near:    Bilateral Near:     Physical Exam Vitals and nursing note reviewed.  Constitutional:      General: He is not in acute distress. HENT:     Head:      Comments: 2cm long simple curved laceration scalp as noted on diagram.  No hematoma present.  No evidence bony step-off.    Right Ear: External ear normal.     Left Ear: External ear normal.     Nose: Nose normal.     Mouth/Throat:     Mouth: Mucous membranes are moist.  Eyes:     Extraocular Movements: Extraocular movements intact.     Conjunctiva/sclera: Conjunctivae normal.     Pupils: Pupils are equal, round, and reactive to light.  Cardiovascular:     Rate and Rhythm: Normal rate.  Pulmonary:     Effort: Pulmonary effort is normal.    Skin:    General: Skin is warm and dry.  Neurological:     Mental Status: He is alert and oriented to person, place, and time.      UC Treatments / Results  Labs (all labs ordered are listed, but only abnormal results are displayed) Labs Reviewed - No data to display  EKG   Radiology No results found.  Procedures Procedures  Laceration Repair Discussed benefits and risks of procedure and verbal consent obtained. Using sterile technique and local anesthesia with 1% lidocaine with epinephrine, cleansed wound with Betadine followed by copious lavage with normal saline.  Wound carefully inspected for debris and foreign bodies; none found.  Wound closed with #7, 4-0 interrupted nylon sutures.  Bacitracin and non-stick sterile dressing applied.  Wound precautions explained to patient.  Return for suture removal in 10 days.   Medications Ordered in UC Medications  Tdap (BOOSTRIX) injection 0.5 mL (has no administration in time range)    Initial Impression / Assessment and Plan / UC Course  I have reviewed the triage vital signs and the nursing notes.  Pertinent labs & imaging results that were available during my care of the patient were reviewed by me and considered in my medical decision making (see chart for details).    Administered Tdap   Final Clinical Impressions(s) / UC Diagnoses   Final diagnoses:  Laceration of scalp, initial encounter     Discharge Instructions     Change dressing daily and apply Bacitracin ointment to wound.  Keep wound clean and dry.  Return for any signs of infection (or follow-up with family doctor):  Increasing redness, swelling, pain, heat, drainage, etc. Return in 10 days for suture removal.     ED Prescriptions    None        Lattie Haw, MD 02/18/20 1721

## 2020-02-16 NOTE — Discharge Instructions (Signed)
Change dressing daily and apply Bacitracin ointment to wound.  Keep wound clean and dry.  Return for any signs of infection (or follow-up with family doctor):  Increasing redness, swelling, pain, heat, drainage, etc. °Return in 10 days for suture removal.   °

## 2020-02-16 NOTE — ED Triage Notes (Signed)
Patient presents to Urgent Care with complaints of approx 1 inch laceration to his scalp since about 30 minutes ago. Patient reports he hit his head on a light fixture when he stood up too fast, unsure of last tetanus but thinks it was within the past 5 years.

## 2020-02-27 ENCOUNTER — Encounter: Payer: Self-pay | Admitting: Emergency Medicine

## 2020-02-27 ENCOUNTER — Other Ambulatory Visit: Payer: Self-pay

## 2020-02-27 ENCOUNTER — Emergency Department
Admission: EM | Admit: 2020-02-27 | Discharge: 2020-02-27 | Disposition: A | Discharge status: Home / Self Care | Payer: BC Managed Care – PPO | Source: Home / Self Care

## 2020-02-27 NOTE — ED Triage Notes (Signed)
Patient here to have his sutures removed from top of head.  Sutures placed on 02/16/2020 here at [facility].

## 2020-02-27 NOTE — ED Notes (Signed)
Sutures removed from top of scalp without difficulty.  #6 sutures removed, 7th one unable to locate.  Checked by RN and provider aware.  Patient will keep an eye on area and follow up as needed.

## 2020-05-12 ENCOUNTER — Other Ambulatory Visit: Payer: Self-pay

## 2020-05-12 MED ORDER — OMEPRAZOLE 40 MG PO CPDR: DELAYED_RELEASE_CAPSULE | ORAL | 0 refills | Status: AC

## 2020-10-26 ENCOUNTER — Encounter: Payer: Self-pay | Admitting: Osteopathic Medicine

## 2020-10-26 ENCOUNTER — Ambulatory Visit (INDEPENDENT_AMBULATORY_CARE_PROVIDER_SITE_OTHER): Payer: BC Managed Care – PPO | Admitting: Osteopathic Medicine

## 2020-10-26 ENCOUNTER — Other Ambulatory Visit: Payer: Self-pay

## 2020-10-26 VITALS — BP 109/52 | HR 59 | Temp 98.1°F | Wt 221.0 lb

## 2020-10-26 DIAGNOSIS — Z0000 Encounter for general adult medical examination without abnormal findings: Secondary | ICD-10-CM

## 2020-10-26 NOTE — Progress Notes (Signed)
Luke Brown is a 40 y.o. male who presents to  [place] at [facility]  today, 10/26/20, seeking care for the following:  . Annual physical       ASSESSMENT & PLAN with other pertinent findings:  The encounter diagnosis was Annual physical exam.    Patient Instructions  General Preventive Care  Most recent routine screening labs: ordered today.   Blood pressure goal 130/80 or less.   Tobacco: don't!   Alcohol: responsible moderation is ok for most adults - if you have concerns about your alcohol intake, please talk to me!   Exercise: as tolerated to reduce risk of cardiovascular disease and diabetes. Strength training will also prevent osteoporosis.   Mental health: if need for mental health care (medicines, counseling, other), or concerns about moods, please let me know!   Sexual / Reproductive health: if need for STD testing, or if concerns with libido/pain problems, please let me know! If you need to discuss family planning, please let me know!   Advanced Directive: Living Will and/or Healthcare Power of Attorney recommended for all adults, regardless of age or health.  Vaccines  Flu vaccine: for almost everyone, every fall.   Shingles vaccine: after age 40.   Pneumonia vaccines: after age 29, sooner if certain medical conditions.  Tetanus booster: every 10 years 01/2030  HPV vaccine: Gardasil up to age 81   COVID vaccine: THANKS for getting vaccinated :)  Cancer screenings   Colon cancer screening: for everyone age 9-75.   Prostate cancer screening: PSA blood test age 73-71  Lung cancer screening: not needed  Infection screenings  . HIV: recommended screening at least once age 49-65 . Gonorrhea/Chlamydia: screening as needed . Hepatitis C: recommended once for everyone age 9-75 . TB: certain at-risk populations, work/school requirements, travel history Other . Bone Density Test: recommended at age  12 . Abdominal Aortic Aneurysm: screening with ultrasound recommended once for men age 87-75 who have ever smoked    Orders Placed This Encounter  Procedures  . CBC  . COMPLETE METABOLIC PANEL WITH GFR  . Lipid panel    No orders of the defined types were placed in this encounter.    See below for relevant physical exam findings  See below for recent lab and imaging results reviewed  Medications, allergies, PMH, PSH, SocH, [place] reviewed below    Follow-up instructions: Return in about 1 year (around 10/26/2021) for [place] (call week prior to visit for lab orders).                                        Exam:  BP (!) 109/52 (BP Location: Left Arm, Patient Position: Sitting, Cuff Size: Large)   Pulse (!) 59   Temp 98.1 F (36.7 C) (Oral)   Wt 221 lb (100.2 kg)   BMI 31.71 kg/m   Constitutional: VS see above. General Appearance: alert, well-developed, well-nourished, NAD  Neck: No masses, trachea midline.   Respiratory: Normal respiratory effort. no wheeze, no rhonchi, no rales  Cardiovascular: S1/S2 normal, no murmur, no rub/gallop auscultated. RRR.   Musculoskeletal: Gait normal. Symmetric and independent movement of all extremities  Abdominal: non-tender, non-distended, no appreciable organomegaly, neg Murphy's, BS WNLx4  Neurological: Normal balance/coordination. No tremor.  Skin: warm, dry, intact.   Psychiatric: Normal judgment/insight. Normal mood and affect. Oriented x3.   Current Meds  Medication  Sig  . omeprazole (PRILOSEC) 40 MG capsule Take 1 capsule (40 mg total) by mouth 2 (two) times daily for 30 days, THEN 1 capsule (40 mg total) daily.    No Known Allergies  Patient Active Problem List   Diagnosis Date Noted  . Acute midline low back pain without sciatica 01/24/2019  . Low serum alkaline phosphatase 01/24/2019  . Bradycardia with 51-60 beats per minute 01/24/2019  . Adult BMI 28.0-28.9 kg/sq m 01/24/2019   . Chronic pain of left knee 08/28/2018  . Chronic midline low back pain without sciatica 08/28/2018  . Sleep disturbance 08/28/2018  . Acne keloidalis nuchae 09/29/2017  . Keratosis pilaris 09/29/2017  . Hyperpigmentation 09/29/2017    Family History  Problem Relation Age of Onset  . Diabetes Mother   . Kidney disease Mother   . Breast cancer Maternal Grandmother   . Dementia Paternal Grandmother   . Lung cancer Paternal Grandfather     Social History   Tobacco Use  Smoking Status Never Smoker  Smokeless Tobacco Never Used    No past surgical history on file.  Immunization History  Administered Date(s) Administered  . DTaP 01/23/1982, 03/29/1982, 06/04/1982, 07/03/1984, 02/02/1987  . IPV 01/23/1982, 03/29/1982, 06/04/1982, 07/03/1984, 02/02/1987  . MMR 04/04/1983, 05/08/1999  . PFIZER(Purple Top)SARS-COV-2 Vaccination 10/03/2019, 10/24/2019, 04/30/2020  . Td 08/01/1995  . Tdap 02/16/2020    No results found for this or any previous visit (from the past 2160 hour(s)).  No results found.     All questions at time of visit were answered - patient instructed to contact office with any additional concerns or updates. ER/RTC precautions were reviewed with the patient as applicable.   Please note: manual typing as well as voice recognition software may have been used to produce this document - typos may escape review. Please contact Dr. Sheppard Coil for any needed clarifications.

## 2020-10-26 NOTE — Patient Instructions (Addendum)
General Preventive Care  Most recent routine screening labs: ordered today.   Blood pressure goal 130/80 or less.   Tobacco: don't!   Alcohol: responsible moderation is ok for most adults - if you have concerns about your alcohol intake, please talk to me!   Exercise: as tolerated to reduce risk of cardiovascular disease and diabetes. Strength training will also prevent osteoporosis.   Mental health: if need for mental health care (medicines, counseling, other), or concerns about moods, please let me know!   Sexual / Reproductive health: if need for STD testing, or if concerns with libido/pain problems, please let me know! If you need to discuss family planning, please let me know!   Advanced Directive: Living Will and/or Healthcare Power of Attorney recommended for all adults, regardless of age or health.  Vaccines  Flu vaccine: for almost everyone, every fall.   Shingles vaccine: after age 68.   Pneumonia vaccines: after age 69, sooner if certain medical conditions.  Tetanus booster: every 10 years 01/2030  HPV vaccine: Gardasil up to age 55   COVID vaccine: THANKS for getting vaccinated :)  Cancer screenings   Colon cancer screening: for everyone age 59-75.   Prostate cancer screening: PSA blood test age 70-71  Lung cancer screening: not needed  Infection screenings  . HIV: recommended screening at least once age 22-65 . Gonorrhea/Chlamydia: screening as needed . Hepatitis C: recommended once for everyone age 68-75 . TB: certain at-risk populations, work/school requirements, travel history Other . Bone Density Test: recommended at age 74 . Abdominal Aortic Aneurysm: screening with ultrasound recommended once for men age 58-75 who have ever smoked

## 2020-10-27 LAB — COMPLETE METABOLIC PANEL WITH GFR
AG Ratio: 1.6 (calc) (ref 1.0–2.5)
ALT: 23 U/L (ref 9–46)
AST: 41 U/L — ABNORMAL HIGH (ref 10–40)
Albumin: 4.9 g/dL (ref 3.6–5.1)
Alkaline phosphatase (APISO): 34 U/L — ABNORMAL LOW (ref 36–130)
BUN: 19 mg/dL (ref 7–25)
CO2: 29 mmol/L (ref 20–32)
Calcium: 10.2 mg/dL (ref 8.6–10.3)
Chloride: 102 mmol/L (ref 98–110)
Creat: 1.34 mg/dL (ref 0.60–1.35)
GFR, Est African American: 77 mL/min/{1.73_m2} (ref >=60)
GFR, Est Non African American: 67 mL/min/{1.73_m2} (ref >=60)
Globulin: 3 g/dL (calc) (ref 1.9–3.7)
Glucose, Bld: 87 mg/dL (ref 65–99)
Potassium: 4.6 mmol/L (ref 3.5–5.3)
Sodium: 139 mmol/L (ref 135–146)
Total Bilirubin: 0.6 mg/dL (ref 0.2–1.2)
Total Protein: 7.9 g/dL (ref 6.1–8.1)

## 2020-10-27 LAB — CBC
HCT: 45.2 % (ref 38.5–50.0)
Hemoglobin: 14.2 g/dL (ref 13.2–17.1)
MCH: 26.3 pg — ABNORMAL LOW (ref 27.0–33.0)
MCHC: 31.4 g/dL — ABNORMAL LOW (ref 32.0–36.0)
MCV: 83.7 fL (ref 80.0–100.0)
MPV: 12 fL (ref 7.5–12.5)
Platelets: 186 10*3/uL (ref 140–400)
RBC: 5.4 10*6/uL (ref 4.20–5.80)
RDW: 14 % (ref 11.0–15.0)
WBC: 4.9 10*3/uL (ref 3.8–10.8)

## 2020-10-27 LAB — LIPID PANEL
Cholesterol: 194 mg/dL (ref <200)
HDL: 81 mg/dL (ref >=40)
LDL Cholesterol (Calc): 98 mg/dL (calc)
Non-HDL Cholesterol (Calc): 113 mg/dL (calc) (ref <130)
Total CHOL/HDL Ratio: 2.4 (calc) (ref <5.0)
Triglycerides: 64 mg/dL (ref <150)

## 2022-04-18 ENCOUNTER — Encounter: Payer: BC Managed Care – PPO | Admitting: Medical-Surgical

## 2023-11-28 ENCOUNTER — Ambulatory Visit (INDEPENDENT_AMBULATORY_CARE_PROVIDER_SITE_OTHER): Admitting: Podiatry

## 2023-11-28 ENCOUNTER — Other Ambulatory Visit: Payer: Self-pay

## 2023-11-28 ENCOUNTER — Ambulatory Visit (INDEPENDENT_AMBULATORY_CARE_PROVIDER_SITE_OTHER)

## 2023-11-28 DIAGNOSIS — M7732 Calcaneal spur, left foot: Secondary | ICD-10-CM

## 2023-11-28 DIAGNOSIS — M19072 Primary osteoarthritis, left ankle and foot: Secondary | ICD-10-CM | POA: Diagnosis not present

## 2023-11-28 DIAGNOSIS — M722 Plantar fascial fibromatosis: Secondary | ICD-10-CM

## 2023-11-28 DIAGNOSIS — M79671 Pain in right foot: Secondary | ICD-10-CM

## 2023-11-28 DIAGNOSIS — M7751 Other enthesopathy of right foot: Secondary | ICD-10-CM

## 2023-11-28 MED ORDER — TRIAMCINOLONE ACETONIDE 10 MG/ML IJ SUSP
2.5000 mg | Freq: Once | INTRAMUSCULAR | Status: AC
  Administered 2023-11-28: 2.5 mg via INTRA_ARTICULAR

## 2023-11-28 MED ORDER — DEXAMETHASONE SODIUM PHOSPHATE 120 MG/30ML IJ SOLN
4.0000 mg | Freq: Once | INTRAMUSCULAR | Status: AC
  Administered 2023-11-28: 4 mg via INTRA_ARTICULAR

## 2023-11-28 NOTE — Patient Instructions (Signed)

## 2023-11-28 NOTE — Progress Notes (Signed)
  Subjective:  Patient ID: Luke Brown, male    DOB: Oct 06, 1980,   MRN: [id number]  No chief complaint on file.   43 y.o. male presents for concern of bilateral plantar fasciitis that has been ongoing for a few weeks now. He has a history of plantar fasciitis and done lots of stretching and support. Has had injections in the past that have been helpful and hoping for another injection today.   . Denies any other pedal complaints. Denies n/v/f/c.   Past Medical History:  Diagnosis Date   Chronic pain of left knee 08/28/2018   Fracture of arm     Objective:  Physical Exam: Vascular: DP/PT pulses 2/4 bilateral. CFT <3 seconds. Normal hair growth on digits. No edema.  Skin. No lacerations or abrasions bilateral feet.  Musculoskeletal: MMT 5/5 bilateral lower extremities in DF, PF, Inversion and Eversion. Deceased ROM in DF of ankle joint. Tender to the medial calcaneal tubercle bilaterally . No pain with achilles, PT or arch. No pain with calcaneal squeeze.  Neurological: Sensation intact to light touch.   Assessment:     1. Plantar fasciitis, bilateral      Plan:  Patient was evaluated and treated and all questions answered. Discussed plantar fasciitis with patient.  X-rays reviewed and discussed with patient. No acute fractures or dislocations noted. Mild spurring noted at inferior calcaneus.  Discussed treatment options including, ice, NSAIDS, supportive shoes, bracing, and stretching. Stretching exercises provided to be done on a daily basis.   Patient requesting injection today. Procedure note below.   Follow-up 6 weeks or sooner if any problems arise. In the meantime, encouraged to call the office with any questions, concerns, change in symptoms.   Procedure:  Discussed etiology, pathology, conservative vs. surgical therapies. At this time a plantar fascial injection was recommended.  The patient agreed and a sterile skin prep was applied.  An injection consisting of  1cc  dexamethasone  0.5 cc kenalog and 1cc marcaine mixture was infiltrated at the point of maximal tenderness on the bilateral Heel.  Bandaid applied. The patient tolerated this well and was given instructions for aftercare.    Jennefer Moats, DPM

## 2024-01-16 ENCOUNTER — Encounter: Payer: Self-pay | Admitting: Podiatry

## 2024-01-16 ENCOUNTER — Ambulatory Visit (INDEPENDENT_AMBULATORY_CARE_PROVIDER_SITE_OTHER): Admitting: Podiatry

## 2024-01-16 DIAGNOSIS — M722 Plantar fascial fibromatosis: Secondary | ICD-10-CM

## 2024-01-16 MED ORDER — TRIAMCINOLONE ACETONIDE 10 MG/ML IJ SUSP
2.5000 mg | Freq: Once | INTRAMUSCULAR | Status: AC
  Administered 2024-01-16: 2.5 mg via INTRA_ARTICULAR

## 2024-01-16 MED ORDER — DEXAMETHASONE SODIUM PHOSPHATE 120 MG/30ML IJ SOLN
4.0000 mg | Freq: Once | INTRAMUSCULAR | Status: AC
  Administered 2024-01-16: 4 mg via INTRA_ARTICULAR

## 2024-01-16 NOTE — Progress Notes (Signed)
  Subjective:  Patient ID: Luke Brown, male    DOB: 1981/01/12,   MRN: [id number]  Chief Complaint  Patient presents with   Plantar Fasciitis    Better but not quite where I want them to be but it's partially my fault.  I didn't take it easy like I should have.    43 y.o. male presents for follow-up of bilateral plantar fasciitis. Relates he was doing better but then got a little worse after a trip to [place]. Sates he is about 50% better. The injection did help. He has been stretching and wearing support.  . Denies any other pedal complaints. Denies n/v/f/c.   Past Medical History:  Diagnosis Date   Chronic pain of left knee 08/28/2018   Fracture of arm     Objective:  Physical Exam: Vascular: DP/PT pulses 2/4 bilateral. CFT <3 seconds. Normal hair growth on digits. No edema.  Skin. No lacerations or abrasions bilateral feet.  Musculoskeletal: MMT 5/5 bilateral lower extremities in DF, PF, Inversion and Eversion. Deceased ROM in DF of ankle joint. Tender to the medial calcaneal tubercle bilaterally . No pain with achilles, PT or arch. No pain with calcaneal squeeze.  Neurological: Sensation intact to light touch.   Assessment:     1. Plantar fasciitis, bilateral       Plan:  Patient was evaluated and treated and all questions answered. Discussed plantar fasciitis with patient.  X-rays reviewed and discussed with patient. No acute fractures or dislocations noted. Mild spurring noted at inferior calcaneus.  Discussed treatment options including, ice, NSAIDS, supportive shoes, bracing, and stretching. Continue stretching and support.   Patient requesting injection today. Procedure note below.   Follow-up 6 weeks or sooner if any problems arise. In the meantime, encouraged to call the office with any questions, concerns, change in symptoms.   Procedure:  Discussed etiology, pathology, conservative vs. surgical therapies. At this time a plantar fascial injection was recommended.   The patient agreed and a sterile skin prep was applied.  An injection consisting of  1cc dexamethasone  0.5 cc kenalog  and 1cc marcaine mixture was infiltrated at the point of maximal tenderness on the bilateral Heel.  Bandaid applied. The patient tolerated this well and was given instructions for aftercare.    Asberry Failing, DPM

## 2024-03-05 ENCOUNTER — Ambulatory Visit: Admitting: Podiatry

## 2024-03-11 ENCOUNTER — Encounter: Payer: Self-pay | Admitting: Podiatry

## 2024-03-11 ENCOUNTER — Ambulatory Visit (INDEPENDENT_AMBULATORY_CARE_PROVIDER_SITE_OTHER): Admitting: Podiatry

## 2024-03-11 DIAGNOSIS — M722 Plantar fascial fibromatosis: Secondary | ICD-10-CM | POA: Diagnosis not present

## 2024-03-11 MED ORDER — TRIAMCINOLONE ACETONIDE 10 MG/ML IJ SUSP
10.0000 mg | Freq: Once | INTRAMUSCULAR | Status: AC
  Administered 2024-03-11: 10 mg

## 2024-03-11 NOTE — Progress Notes (Signed)
 Patient presents complaining of pain in the plantar lateral aspect of the heel right.  Has been bothering him for several weeks.  Has been injected previously.  But more on the medial side.   Physical exam:  General appearance: Pleasant, and in no acute distress. AOx3.  Vascular: Pedal pulses: DP 2/4 bilaterally, PT 2/4 bilaterally. Mild edema lower legs bilaterally. Capillary fill time immediate.  Neurological: Light touch intact feet bilaterally.  Normal Achilles reflex bilaterally.  No clonus or spasticity noted.  Negative Tinel's sign tarsal tunnel and porta pedis bilaterally  Dermatologic:   Skin normal temperature bilaterally.  Skin normal color, tone, and texture bilaterally.   Musculoskeletal: Tenderness plantar medial heel at the plantar central aspect and plantar plantar lateral calcaneal tubercle.    Diagnosis: 1.  Plantar fasciitis right.  Plan: -Discussed the plan of fasciitis and etiology and treatment we will try an injection.  Wear good stable shoes and ice and stretch.  -injected 3cc 2:1 mixture 0.5 cc Marcaine:Kenolog 10mg /81ml at lateral plantar fascial origin lateral calcaneal tuber.   Return 2 weeks follow-up injection plantar

## 2024-03-26 ENCOUNTER — Ambulatory Visit: Admitting: Podiatry

## 2024-07-14 ENCOUNTER — Ambulatory Visit: Admitting: Podiatry
